# Patient Record
Sex: Female | Born: 1960
Health system: Southern US, Community
[De-identification: ages and names within clinical notes are randomized; demographics above are authoritative.]

## PROBLEM LIST (undated history)

## (undated) DIAGNOSIS — Z973 Presence of spectacles and contact lenses: Secondary | ICD-10-CM

## (undated) DIAGNOSIS — M1990 Unspecified osteoarthritis, unspecified site: Secondary | ICD-10-CM

## (undated) DIAGNOSIS — N950 Postmenopausal bleeding: Secondary | ICD-10-CM

## (undated) DIAGNOSIS — N840 Polyp of corpus uteri: Secondary | ICD-10-CM

## (undated) DIAGNOSIS — B029 Zoster without complications: Secondary | ICD-10-CM

## (undated) DIAGNOSIS — R7303 Prediabetes: Secondary | ICD-10-CM

## (undated) DIAGNOSIS — I10 Essential (primary) hypertension: Secondary | ICD-10-CM

## (undated) DIAGNOSIS — D259 Leiomyoma of uterus, unspecified: Secondary | ICD-10-CM

## (undated) HISTORY — PX: NO PAST SURGERIES: SHX2092

## (undated) HISTORY — PX: WISDOM TOOTH EXTRACTION: SHX21

## (undated) HISTORY — DX: Zoster without complications: B02.9

---

## 1998-08-31 ENCOUNTER — Encounter: Payer: Self-pay | Admitting: Obstetrics and Gynecology

## 1998-08-31 ENCOUNTER — Ambulatory Visit (HOSPITAL_COMMUNITY): Admission: RE | Admit: 1998-08-31 | Discharge: 1998-08-31 | Payer: Self-pay | Admitting: Obstetrics and Gynecology

## 2000-09-04 ENCOUNTER — Other Ambulatory Visit: Admission: RE | Admit: 2000-09-04 | Discharge: 2000-09-04 | Payer: Self-pay | Admitting: Obstetrics and Gynecology

## 2001-05-27 ENCOUNTER — Emergency Department (HOSPITAL_COMMUNITY): Admission: EM | Admit: 2001-05-27 | Discharge: 2001-05-27 | Payer: Self-pay

## 2002-04-06 ENCOUNTER — Other Ambulatory Visit: Admission: RE | Admit: 2002-04-06 | Discharge: 2002-04-06 | Payer: Self-pay | Admitting: Obstetrics and Gynecology

## 2002-04-17 ENCOUNTER — Ambulatory Visit (HOSPITAL_COMMUNITY): Admission: RE | Admit: 2002-04-17 | Discharge: 2002-04-17 | Payer: Self-pay | Admitting: Obstetrics and Gynecology

## 2002-04-17 ENCOUNTER — Encounter: Payer: Self-pay | Admitting: Obstetrics and Gynecology

## 2003-03-30 ENCOUNTER — Other Ambulatory Visit: Admission: RE | Admit: 2003-03-30 | Discharge: 2003-03-30 | Payer: Self-pay | Admitting: Obstetrics and Gynecology

## 2004-03-31 ENCOUNTER — Other Ambulatory Visit: Admission: RE | Admit: 2004-03-31 | Discharge: 2004-03-31 | Payer: Self-pay | Admitting: Obstetrics and Gynecology

## 2005-04-03 ENCOUNTER — Other Ambulatory Visit: Admission: RE | Admit: 2005-04-03 | Discharge: 2005-04-03 | Payer: Self-pay | Admitting: Obstetrics and Gynecology

## 2006-04-12 ENCOUNTER — Other Ambulatory Visit: Admission: RE | Admit: 2006-04-12 | Discharge: 2006-04-12 | Payer: Self-pay | Admitting: Obstetrics and Gynecology

## 2006-04-23 ENCOUNTER — Encounter: Admission: RE | Admit: 2006-04-23 | Discharge: 2006-04-23 | Payer: Self-pay | Admitting: Obstetrics and Gynecology

## 2007-04-15 ENCOUNTER — Other Ambulatory Visit: Admission: RE | Admit: 2007-04-15 | Discharge: 2007-04-15 | Payer: Self-pay | Admitting: Obstetrics and Gynecology

## 2009-05-17 ENCOUNTER — Emergency Department (HOSPITAL_COMMUNITY): Admission: EM | Admit: 2009-05-17 | Discharge: 2009-05-17 | Payer: Self-pay | Admitting: Family Medicine

## 2009-09-07 ENCOUNTER — Ambulatory Visit: Payer: Self-pay | Admitting: Obstetrics and Gynecology

## 2012-04-05 ENCOUNTER — Emergency Department (HOSPITAL_COMMUNITY)
Admission: EM | Admit: 2012-04-05 | Discharge: 2012-04-05 | Disposition: A | Discharge status: Home / Self Care | Payer: Self-pay | Attending: Emergency Medicine | Admitting: Emergency Medicine

## 2012-04-05 ENCOUNTER — Emergency Department (HOSPITAL_COMMUNITY): Payer: Self-pay

## 2012-04-05 ENCOUNTER — Encounter (HOSPITAL_COMMUNITY): Payer: Self-pay | Admitting: *Deleted

## 2012-04-05 DIAGNOSIS — I10 Essential (primary) hypertension: Secondary | ICD-10-CM | POA: Insufficient documentation

## 2012-04-05 DIAGNOSIS — M25559 Pain in unspecified hip: Secondary | ICD-10-CM | POA: Insufficient documentation

## 2012-04-05 HISTORY — DX: Essential (primary) hypertension: I10

## 2012-04-05 MED ORDER — KETOROLAC TROMETHAMINE 60 MG/2ML IM SOLN
60.0000 mg | Freq: Once | INTRAMUSCULAR | Status: AC
  Administered 2012-04-05: 60 mg via INTRAMUSCULAR
  Filled 2012-04-05: qty 2

## 2012-04-05 MED ORDER — OXYCODONE-ACETAMINOPHEN 5-325 MG PO TABS: 2.0000 | ORAL_TABLET | ORAL | Status: AC | PRN

## 2012-04-05 NOTE — ED Notes (Signed)
Pt given discharge instructions, explained to pt, pt in no distress. Escorted to discharge window.

## 2012-04-05 NOTE — ED Provider Notes (Signed)
History     CSN: [id number]  Arrival date & time 04/05/12  7124   First MD Initiated Contact with Patient 04/05/12 [phone]      Chief Complaint  Patient presents with  . Hip Pain    right    (Consider location/radiation/quality/duration/timing/severity/associated sxs/prior treatment) Patient is a 51 y.o. female presenting with hip pain. The history is provided by the patient.  Hip Pain   51 year old, morbidly obese, female, presents to emergency department complaining of nontraumatic.  Denies fevers, chills, rash.  She denies weakness in her lower extremity.  She has tried to take nonsteroidal medications, however, she did not get any relief.  She denies urinary tract symptoms.  Past Medical History  Diagnosis Date  . Hypertension     History reviewed. No pertinent past surgical history.  History reviewed. No pertinent family history.  History  Substance Use Topics  . Smoking status: Never Smoker   . Smokeless tobacco: Never Used  . Alcohol Use: No    OB History    Grav Para Term Preterm Abortions TAB SAB Ect Mult Living                  Review of Systems  Constitutional: Negative for fever and chills.  Genitourinary: Negative for dysuria and hematuria.  Musculoskeletal: Negative for back pain.       Right hip pain  Neurological: Negative for weakness.  All other systems reviewed and are negative.    Allergies  Review of patient's allergies indicates no known allergies.  Home Medications   Current Outpatient Rx  Name Route Sig Dispense Refill  . NAPROXEN SODIUM 220 MG PO TABS Oral Take 440 mg by mouth once.      BP 176/107  Pulse 88  Temp(Src) 98.1 F (36.7 C) (Oral)  Resp 18  Wt 290 lb (131.543 kg)  SpO2 100%  LMP 01/14/2012  Physical Exam  Vitals reviewed. Constitutional: She is oriented to person, place, and time. She appears well-developed and well-nourished.  HENT:  Head: Normocephalic and atraumatic.  Eyes: Conjunctivae are normal.    Neck: Normal range of motion.  Musculoskeletal: Normal range of motion. She exhibits tenderness. She exhibits no edema.       Mild tenderness to the right hip No increase in pain with hip flexion or internal or external rotation at the hip  Neurological: She is alert and oriented to person, place, and time.  Skin: Skin is warm and dry.  Psychiatric: She has a normal mood and affect. Thought content normal.    ED Course  Procedures (including critical care time)  Labs Reviewed - No data to display No results found.   No diagnosis found.    MDM  nontraumatic Right hip pain        Cheri Guppy, MD 04/05/12 [phone]

## 2012-04-05 NOTE — ED Notes (Signed)
Pt from home with reports of right hip pain that radiates around to right lower back that started 3 days ago. Pt denies dysuria, surgery or injury.

## 2012-04-05 NOTE — ED Notes (Signed)
Registration at bedside.

## 2012-04-05 NOTE — Discharge Instructions (Signed)
Your xray does not show any significant illness or injury.  Use ibuprofen 600 mg every 6 hours for pain. Decrease your activity.  Use percocet for more severe pain.   Follow up with your doctor for reevaluation if your symptoms persist.

## 2012-04-11 ENCOUNTER — Encounter (HOSPITAL_COMMUNITY): Payer: Self-pay | Admitting: *Deleted

## 2012-04-11 ENCOUNTER — Emergency Department (HOSPITAL_COMMUNITY)
Admission: EM | Admit: 2012-04-11 | Discharge: 2012-04-11 | Disposition: A | Discharge status: Home / Self Care | Payer: Self-pay | Attending: Emergency Medicine | Admitting: Emergency Medicine

## 2012-04-11 DIAGNOSIS — I10 Essential (primary) hypertension: Secondary | ICD-10-CM | POA: Insufficient documentation

## 2012-04-11 DIAGNOSIS — M25559 Pain in unspecified hip: Secondary | ICD-10-CM | POA: Insufficient documentation

## 2012-04-11 DIAGNOSIS — R21 Rash and other nonspecific skin eruption: Secondary | ICD-10-CM | POA: Insufficient documentation

## 2012-04-11 MED ORDER — VALACYCLOVIR HCL 1 G PO TABS: 1000.0000 mg | ORAL_TABLET | Freq: Three times a day (TID) | ORAL | Status: AC

## 2012-04-11 MED ORDER — OXYCODONE-ACETAMINOPHEN 5-325 MG PO TABS: 1.0000 | ORAL_TABLET | Freq: Four times a day (QID) | ORAL | Status: AC | PRN

## 2012-04-11 NOTE — ED Notes (Signed)
Pt states "I think my doctor is out of town, I'm here for a follow up, I called my doctor to call in some more medicine, my right hip is still bothering me"

## 2012-04-11 NOTE — ED Provider Notes (Signed)
Medical screening examination/treatment/procedure(s) were conducted as a shared visit with non-physician practitioner(s) and myself.  I personally evaluated the patient during the encounter  The patient appears to have zoster with a dermatomal distribution to explain her pain  Lyanne Co, MD 04/11/12 [phone]

## 2012-04-11 NOTE — Discharge Instructions (Signed)
Return here as needed.       Shingles Shingles is caused by the same virus that causes chickenpox (varicella zoster virus or VZV). Shingles often occurs many years or decades after having chickenpox. That is why it is more common in adults older than 50 years. The virus reactivates and breaks out as an infection in a nerve root. SYMPTOMS   The initial feeling (sensations) may be pain. This pain is usually described as:   Burning.   Stabbing.   Throbbing.   Tingling in the nerve root.   A red rash will follow in a couple days. The rash may occur in any area of the body and is usually on one side (unilateral) of the body in a band or belt-like pattern. The rash usually starts out as very small blisters (vesicles). They will dry up after 7 to 10 days. This is not usually a significant problem except for the pain it causes.   Long-lasting (chronic) pain is more likely in an elderly person. It can last months to years. This condition is called postherpetic neuralgia.  Shingles can be an extremely severe infection in someone with AIDS, a weakened immune system, or with forms of leukemia. It can also be severe if you are taking transplant medicines or other medicines that weaken the immune system. TREATMENT  Your caregiver will often treat you with:  Antiviral drugs.   Anti-inflammatory drugs.   Pain medicines.  Bed rest is very important in preventing the pain associated with herpes zoster (postherpetic neuralgia). Application of heat in the form of a hot water bottle or electric heating pad or gentle pressure with the hand is recommended to help with the pain or discomfort. PREVENTION  A varicella zoster vaccine is available to help protect against the virus. The Food and Drug Administration approved the varicella zoster vaccine for individuals 38 years of age and older. HOME CARE INSTRUCTIONS   Cool compresses to the area of rash may be helpful.   Only take over-the-counter or  prescription medicines for pain, discomfort, or fever as directed by your caregiver.   Avoid contact with:   Babies.   Pregnant women.   Children with eczema.   Elderly people with transplants.   People with chronic illnesses, such as leukemia and AIDS.   If the area involved is on your face, you may receive a referral for follow-up to a specialist. It is very important to keep all follow-up appointments. This will help avoid eye complications, chronic pain, or disability.  SEEK IMMEDIATE MEDICAL CARE IF:   You develop any pain (headache) in the area of the face or eye. This must be followed carefully by your caregiver or ophthalmologist. An infection in part of your eye (cornea) can be very serious. It could lead to blindness.   You do not have pain relief from prescribed medicines.   Your redness or swelling spreads.   The area involved becomes very swollen and painful.   You have a fever.   You notice any red or painful lines extending away from the affected area toward your heart (lymphangitis).   Your condition is worsening or has changed.  Document Released: 11/05/2005 Document Revised: 10/25/2011 Document Reviewed: 10/10/2009 [facility] Patient Information 2012 [place], [place].

## 2012-04-11 NOTE — ED Provider Notes (Signed)
History     CSN: [id number]  Arrival date & time 04/11/12  4782   First MD Initiated Contact with Patient 04/11/12 [phone]      Chief Complaint  Patient presents with  . Hip Pain    (Consider location/radiation/quality/duration/timing/severity/associated sxs/prior treatment) HPI Patient has emergency department with lower back and right hip pain.  She states that she was seen in emergency department about 6 days ago for evaluation of this pain.  She is placed on pain medication and anti-inflammatories.  She states that several days after her visit she noticed a rash on her lower leg and then this morning noticed a rash on her knee.  Patient says the rash is somewhat painful, but does not itch.  Past Medical History  Diagnosis Date  . Hypertension     History reviewed. No pertinent past surgical history.  No family history on file.  History  Substance Use Topics  . Smoking status: Never Smoker   . Smokeless tobacco: Never Used  . Alcohol Use: No    OB History    Grav Para Term Preterm Abortions TAB SAB Ect Mult Living                  Review of Systems All other systems negative except as documented in the HPI. All pertinent positives and negatives as reviewed in the HPI.  Allergies  Review of patient's allergies indicates no known allergies.  Home Medications   Current Outpatient Rx  Name Route Sig Dispense Refill  . NAPROXEN SODIUM 220 MG PO TABS Oral Take 220 mg by mouth 2 (two) times daily as needed. For pain.    . OXYCODONE-ACETAMINOPHEN 5-325 MG PO TABS Oral Take 2 tablets by mouth every 4 (four) hours as needed for pain. 15 tablet 0    BP 181/78  Pulse 70  Temp(Src) 98.4 F (36.9 C) (Oral)  Resp 19  Wt 290 lb (131.543 kg)  SpO2 100%  LMP 01/14/2012  Physical Exam  Constitutional: She is oriented to person, place, and time. She appears well-developed and well-nourished.  HENT:  Head: Normocephalic and atraumatic.  Cardiovascular: Normal rate and  regular rhythm.   Pulmonary/Chest: Effort normal and breath sounds normal.  Neurological: She is alert and oriented to person, place, and time. She displays normal reflexes. She exhibits normal muscle tone. Coordination normal.  Skin: Skin is warm and dry.       ED Course  Procedures (including critical care time)  I feel that the patient has herpes zoster based on her presentation initially with her with the pain in L5-L4, region.  The rash started 2 days after presentation to the emergency department thus lending itself to be consistent with herpes zoster.  Patient is given the plan and she voices an understanding.   MDM          Carlyle Dolly, PA-C 04/11/12 [phone]

## 2012-07-16 IMAGING — CR DG HIP COMPLETE 2+V*R*
3 series · 3 of 3 positions shown · non-contrast
Comparison: None.

CLINICAL DATA: Right hip pain.

RIGHT HIP - COMPLETE 2+ VIEW

[t pelvis ap]
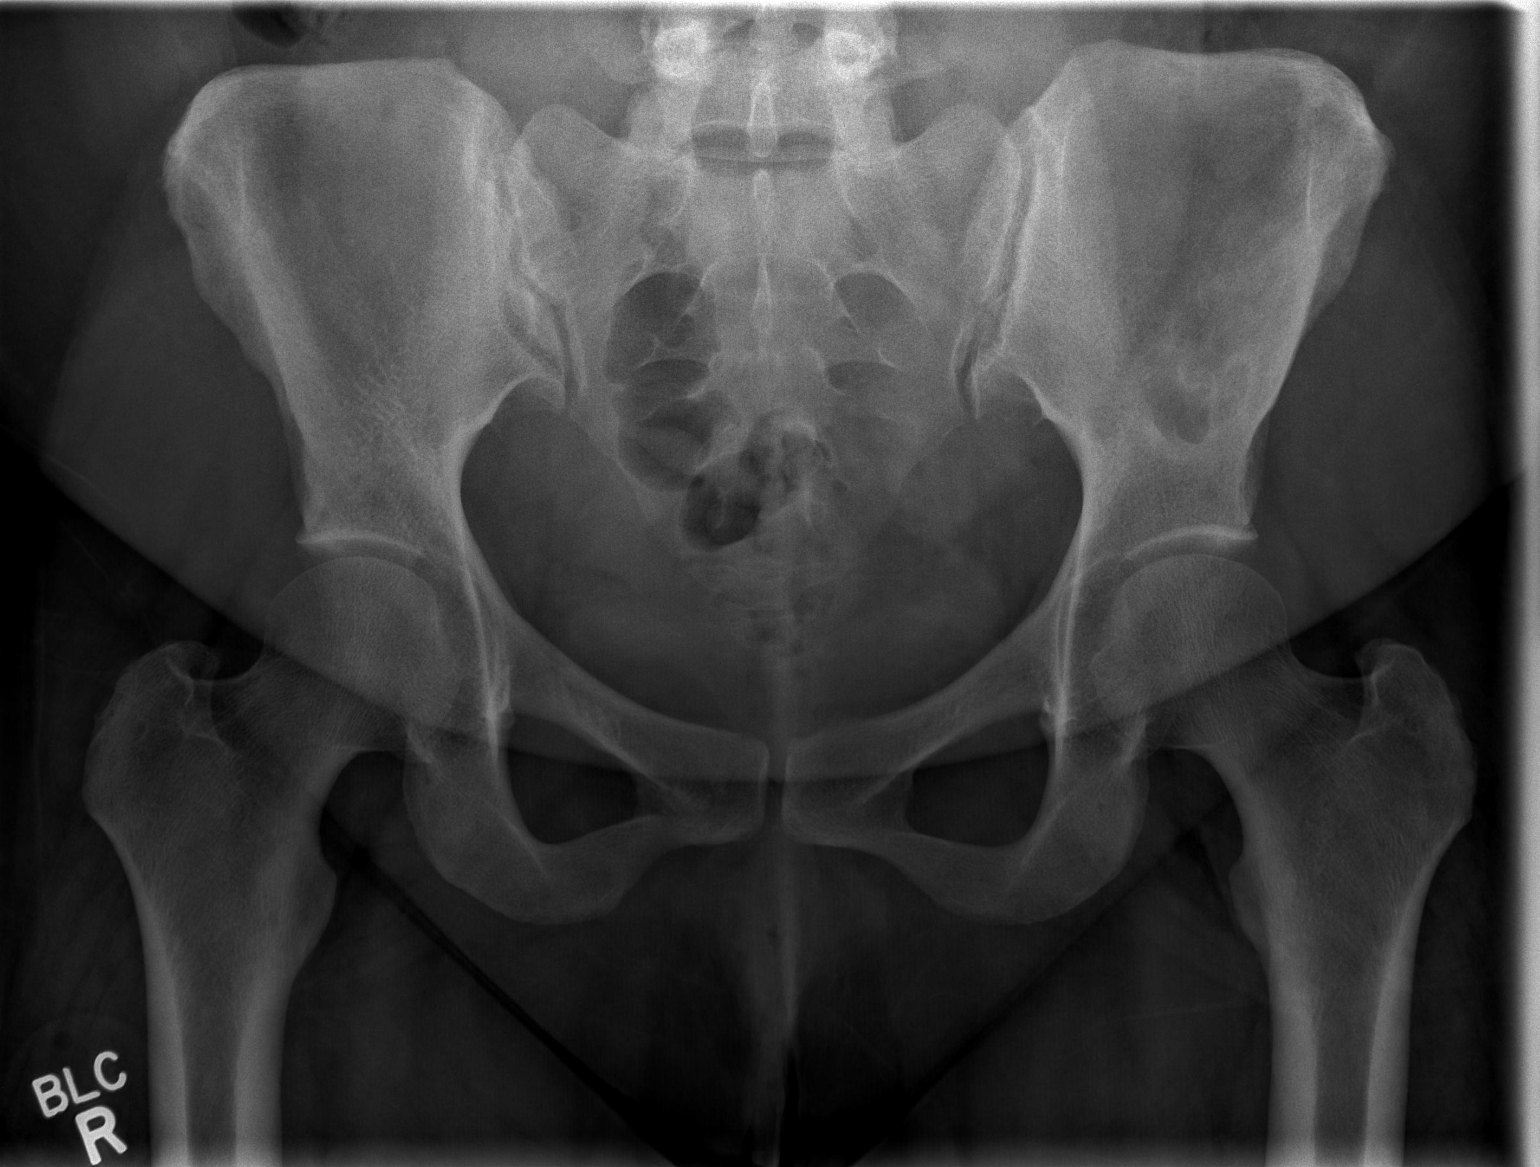

[t hip ap right]
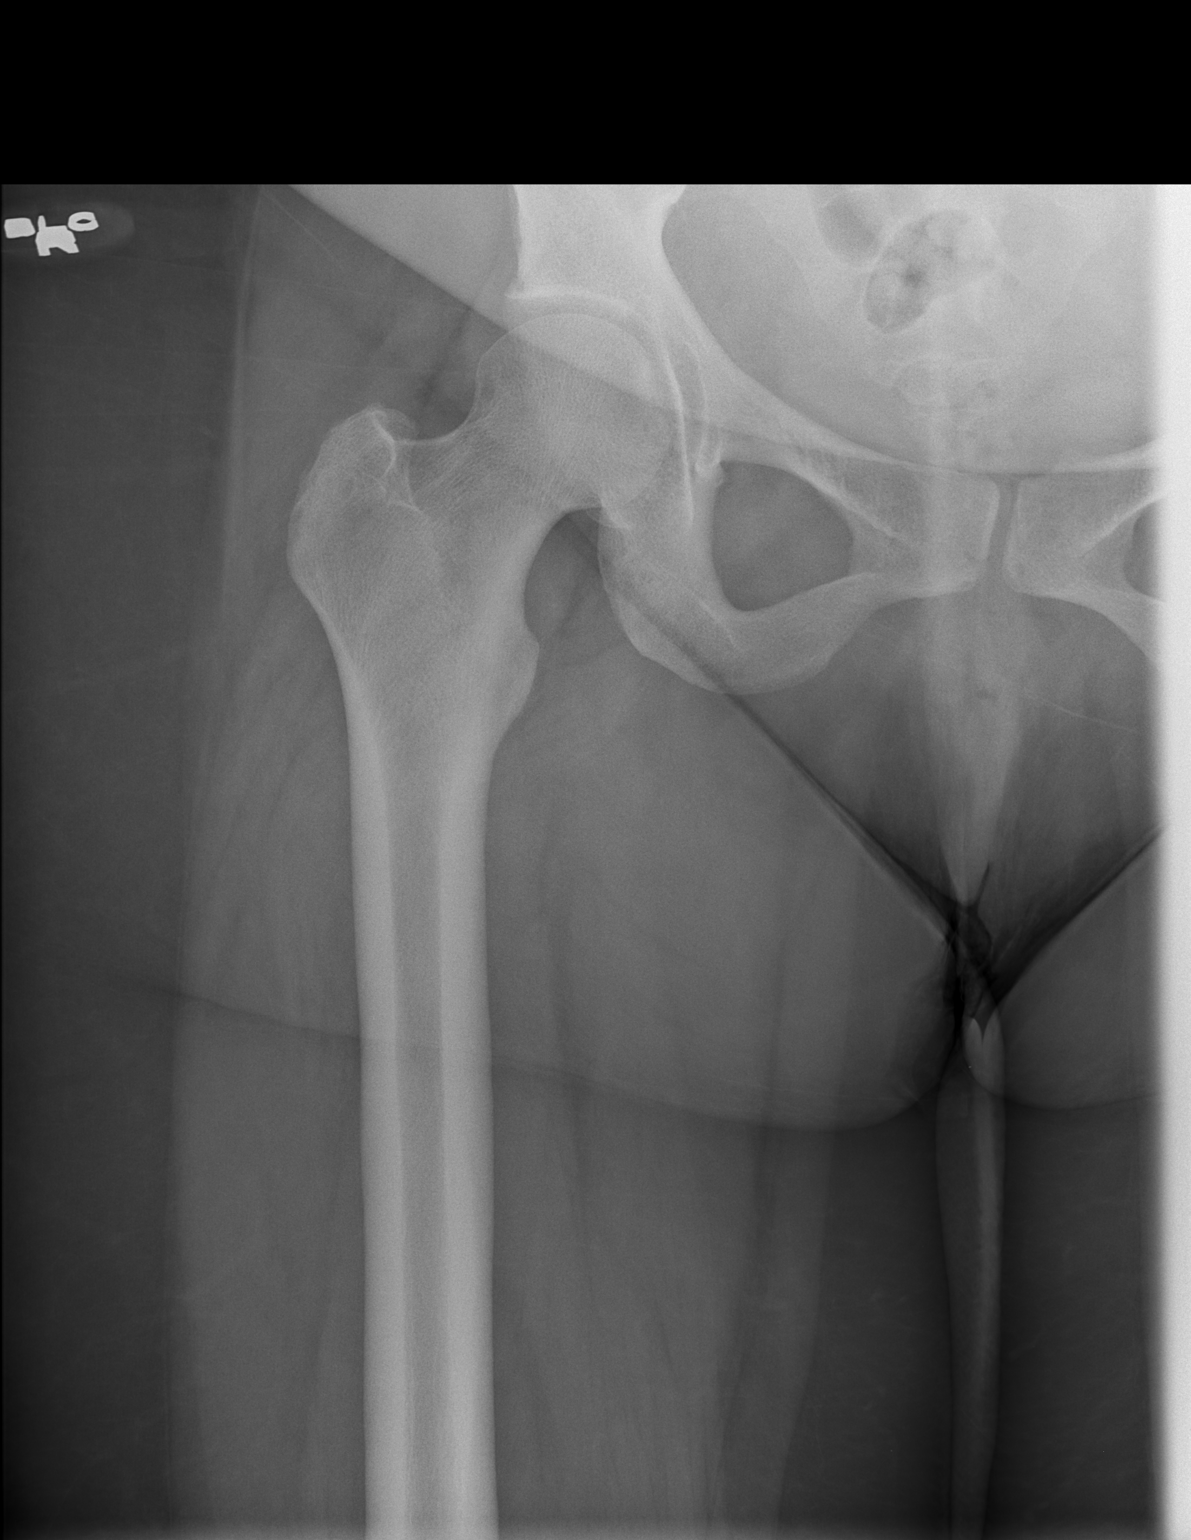

[t hip frog leg right]
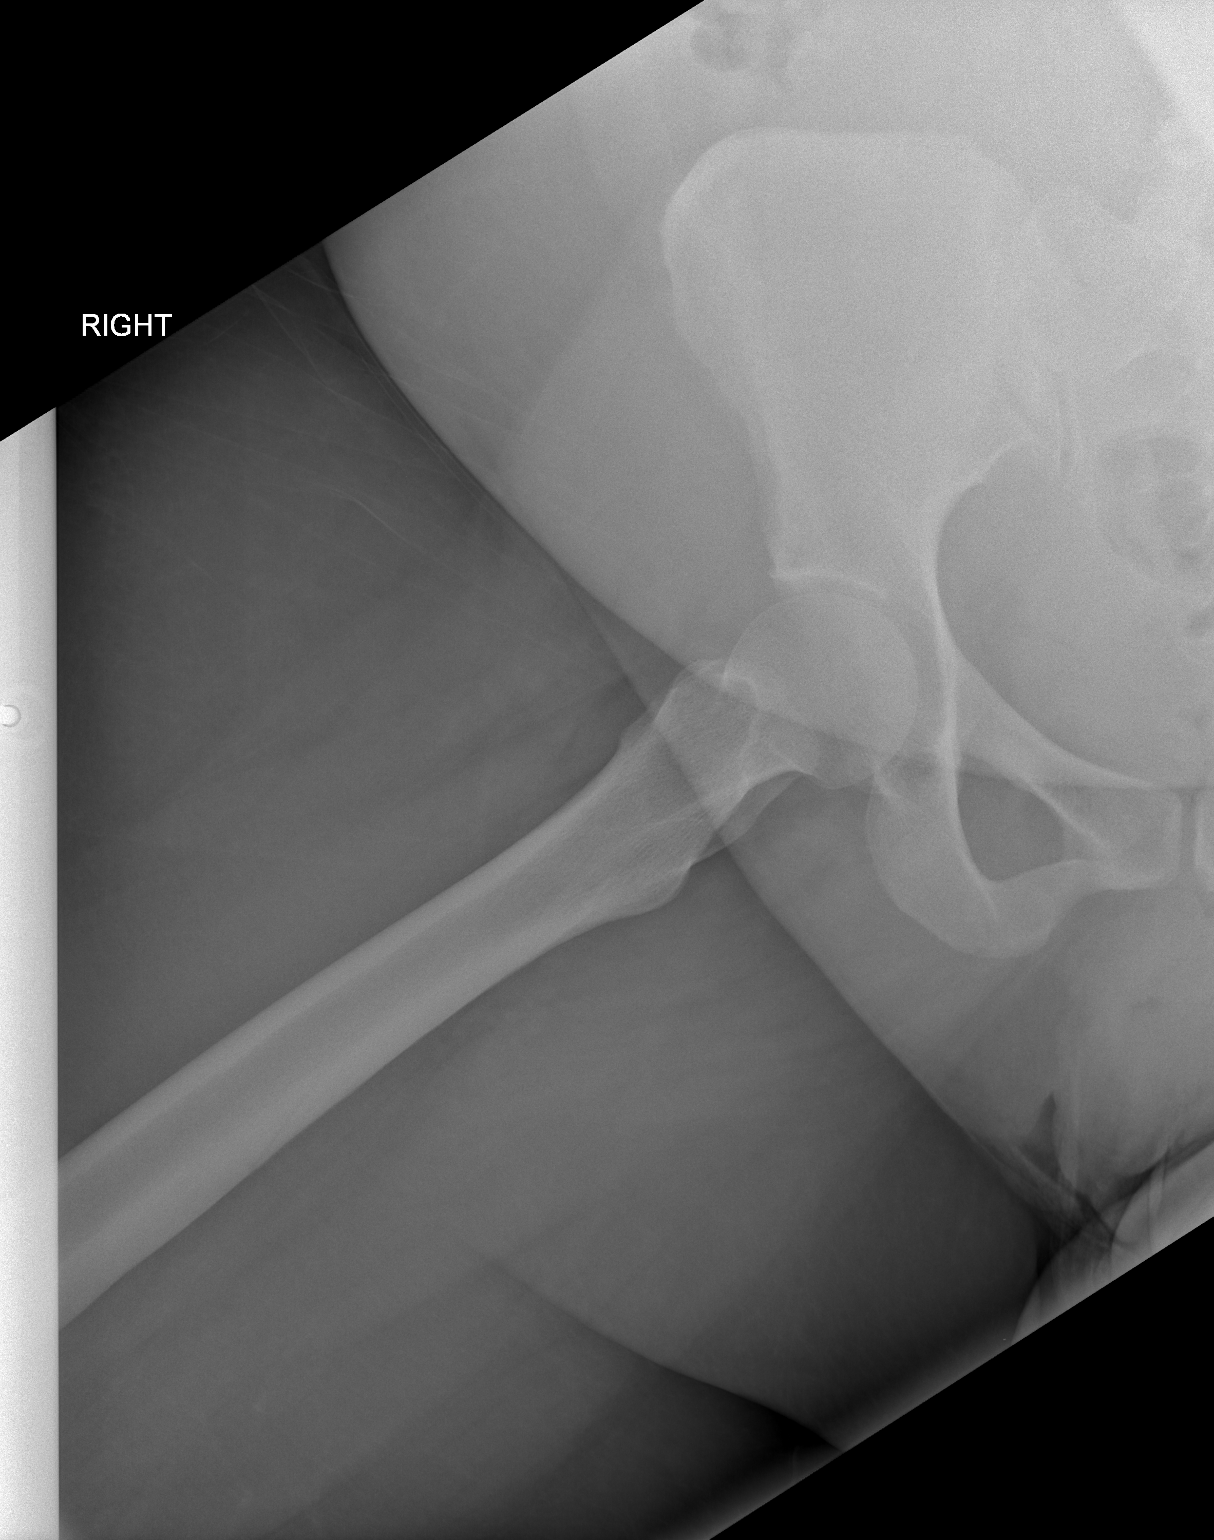

[3 of 3 positions shown; findings below may reference images not displayed]

FINDINGS: The osseous structures of the hip are normal.  No joint
space narrowing or appreciable effusion.
IMPRESSION: Normal right hip.

## 2013-04-06 ENCOUNTER — Ambulatory Visit (INDEPENDENT_AMBULATORY_CARE_PROVIDER_SITE_OTHER): Payer: BC Managed Care – PPO | Admitting: Women's Health

## 2013-04-06 ENCOUNTER — Encounter: Payer: Self-pay | Admitting: Women's Health

## 2013-04-06 ENCOUNTER — Other Ambulatory Visit (HOSPITAL_COMMUNITY)
Admission: RE | Admit: 2013-04-06 | Discharge: 2013-04-06 | Disposition: A | Discharge status: Home / Self Care | Payer: Self-pay | Source: Ambulatory Visit | Attending: Women's Health | Admitting: Women's Health

## 2013-04-06 VITALS — BP 144/90 | Ht 67.0 in | Wt 268.0 lb

## 2013-04-06 DIAGNOSIS — N912 Amenorrhea, unspecified: Secondary | ICD-10-CM

## 2013-04-06 DIAGNOSIS — I10 Essential (primary) hypertension: Secondary | ICD-10-CM

## 2013-04-06 DIAGNOSIS — D219 Benign neoplasm of connective and other soft tissue, unspecified: Secondary | ICD-10-CM | POA: Insufficient documentation

## 2013-04-06 DIAGNOSIS — D259 Leiomyoma of uterus, unspecified: Secondary | ICD-10-CM

## 2013-04-06 DIAGNOSIS — Z01419 Encounter for gynecological examination (general) (routine) without abnormal findings: Secondary | ICD-10-CM | POA: Insufficient documentation

## 2013-04-06 LAB — FOLLICLE STIMULATING HORMONE: FSH: 0.4 m[IU]/mL

## 2013-04-06 LAB — PROLACTIN: Prolactin: 18.6 ng/mL

## 2013-04-06 NOTE — Progress Notes (Signed)
Lindsey Norton Jan 26, 1961 [id number]    History:    The patient presents for annual exam.  Amenorrheic x1 year. History of elevated prolactin, Dostinex in the past, has been off x4 years. History of 5 mm pituitary microadenoma with a negative MRI 2007. Not sexually active in years. Last Pap 2009, last office visit here 2008 due to lack of insurance. Shingles 2013. Hypertension primary care manages labs and meds. Last mammogram 2003. Had a negative colonoscopy approximately 5 years ago. Asymptomatic fibroids.   Past medical history, past surgical history, family history and social history were all reviewed and documented in the EPIC chart. Has been in and out of work for the past 5 years, without insurance. Has been seen by primary care to manage hypertension. Numerous family members with hypertension.   ROS:  A  ROS was performed and pertinent positives and negatives are included in the history.  Exam:  Filed Vitals:   04/06/13 0810  BP: 144/90    General appearance:  Normal Head/Neck:  Normal, without cervical or supraclavicular adenopathy. Thyroid:  Symmetrical, normal in size, without palpable masses or nodularity. Respiratory  Effort:  Normal  Auscultation:  Clear without wheezing or rhonchi Cardiovascular  Auscultation:  Regular rate, without rubs, murmurs or gallops  Edema/varicosities:  Not grossly evident Abdominal  Soft,nontender, without masses, guarding or rebound.  Liver/spleen:  No organomegaly noted  Hernia:  None appreciated  Skin  Inspection:  Grossly normal  Palpation:  Grossly normal Neurologic/psychiatric  Orientation:  Normal with appropriate conversation.  Mood/affect:  Normal  Genitourinary    Breasts: Examined lying and sitting.     Right: Without masses, retractions, discharge or axillary adenopathy.     Left: Without masses, retractions, discharge or axillary adenopathy.   Inguinal/mons:  Normal without inguinal adenopathy  External genitalia:   Normal  BUS/Urethra/Skene's glands:  Normal  Bladder:  Normal  Vagina:  Normal  Cervix:  Normal  Uterus:  Enlarged, 16 week size, nontender,  Midline and mobile  Adnexa/parametria:     Rt: Without masses or tenderness.   Lt: Without masses or tenderness.  Anus and perineum: Normal  Digital rectal exam: Normal sphincter tone without palpated masses or tenderness  Assessment/Plan:  52 y.o. SBF G0 for annual exam.     Hypertension-primary care labs and meds Obesity History of pituitary microadenoma/elevated prolactin Amenorrhea fibroids  Plan: Ultrasound, prolactin,  FSH, UA, Pap. New screening guidelines reviewed, last normal Pap 2009. SBE's, overdue, instructed to schedule ASAP, due for colonoscopy will schedule. Reviewed importance of decreasing calories and increasing exercise for weight loss and health. Condoms encouraged if become sexually active. Instructed to call if any further bleeding, will triage based on results.    Harrington Challenger [facility], 8:41 AM 04/06/2013

## 2013-04-06 NOTE — Patient Instructions (Addendum)

## 2013-04-07 ENCOUNTER — Encounter: Payer: Self-pay | Admitting: Obstetrics and Gynecology

## 2013-04-14 ENCOUNTER — Encounter: Payer: Self-pay | Admitting: Obstetrics and Gynecology

## 2013-04-27 ENCOUNTER — Encounter: Payer: Self-pay | Admitting: Women's Health

## 2013-04-27 ENCOUNTER — Ambulatory Visit (INDEPENDENT_AMBULATORY_CARE_PROVIDER_SITE_OTHER): Payer: BC Managed Care – PPO

## 2013-04-27 ENCOUNTER — Ambulatory Visit (INDEPENDENT_AMBULATORY_CARE_PROVIDER_SITE_OTHER): Payer: BC Managed Care – PPO | Admitting: Women's Health

## 2013-04-27 ENCOUNTER — Other Ambulatory Visit: Payer: Self-pay | Admitting: Women's Health

## 2013-04-27 DIAGNOSIS — D251 Intramural leiomyoma of uterus: Secondary | ICD-10-CM

## 2013-04-27 DIAGNOSIS — D259 Leiomyoma of uterus, unspecified: Secondary | ICD-10-CM

## 2013-04-27 DIAGNOSIS — D219 Benign neoplasm of connective and other soft tissue, unspecified: Secondary | ICD-10-CM

## 2013-04-27 DIAGNOSIS — R9389 Abnormal findings on diagnostic imaging of other specified body structures: Secondary | ICD-10-CM

## 2013-04-27 DIAGNOSIS — N852 Hypertrophy of uterus: Secondary | ICD-10-CM

## 2013-04-27 DIAGNOSIS — N83339 Acquired atrophy of ovary and fallopian tube, unspecified side: Secondary | ICD-10-CM

## 2013-04-27 DIAGNOSIS — N912 Amenorrhea, unspecified: Secondary | ICD-10-CM

## 2013-04-27 MED ORDER — MEDROXYPROGESTERONE ACETATE 10 MG PO TABS: ORAL_TABLET | ORAL | Status: DC

## 2013-04-27 NOTE — Patient Instructions (Addendum)
Fibroids Fibroids are lumps (tumors) that can occur any place in a woman's body. These lumps are not cancerous. Fibroids vary in size, weight, and where they grow. HOME CARE  Do not take aspirin.  Write down the number of pads or tampons you use during your period. Tell your doctor. This can help determine the best treatment for you. GET HELP RIGHT AWAY IF:  You have pain in your lower belly (abdomen) that is not helped with medicine.  You have cramps that are not helped with medicine.  You have more bleeding between or during your period.  You feel lightheaded or pass out (faint).  Your lower belly pain gets worse. MAKE SURE YOU:  Understand these instructions.  Will watch your condition.  Will get help right away if you are not doing well or get worse. Document Released: 12/08/2010 Document Revised: 01/28/2012 Document Reviewed: 12/08/2010 [employer] Patient Information 2014 [employer].  

## 2013-04-27 NOTE — Progress Notes (Signed)
Patient ID: Lindsey Norton, female   DOB: 21-Mar-1961, 52 y.o.   MRN: [id number] Presents for ultrasound. History of fibroids. At annual exam 5/19 reports amenorrhea for one year,  FSH  0.4. History of increased prolactin, had been on Dostinex in the past, prolactin 18.6. Not sexually active years. Had a cycle after annual, May 23 without medication.   Ultrasound: Enlarged uterus 228 mm, fibroid left 13 x 12 x 16 cm, second fibroid on previous ultrasound unable to IUD do to enlarged uterus. Displaced endometrium, endometrial cavity prominent avascular, 9.9 mm. Right ovary atrophic, left ovary not seen. Negative cul-de-sac.  Fibroid uterus Amenorrhea  Plan: Provera 10 mg for 5 days every other month if no cycle. Instructed to call if no menstrual cycle after Provera. Reviewed fibroid is large, report symptoms of urinary changes, GI changes.

## 2013-05-06 ENCOUNTER — Encounter: Payer: Self-pay | Admitting: Women's Health

## 2013-09-24 ENCOUNTER — Other Ambulatory Visit: Payer: Self-pay

## 2014-09-03 ENCOUNTER — Other Ambulatory Visit: Payer: Self-pay

## 2016-08-02 ENCOUNTER — Ambulatory Visit (INDEPENDENT_AMBULATORY_CARE_PROVIDER_SITE_OTHER): Payer: 59 | Admitting: Women's Health

## 2016-08-02 ENCOUNTER — Encounter: Payer: Self-pay | Admitting: Women's Health

## 2016-08-02 VITALS — BP 130/85 | Ht 66.0 in | Wt 302.0 lb

## 2016-08-02 DIAGNOSIS — D259 Leiomyoma of uterus, unspecified: Secondary | ICD-10-CM

## 2016-08-02 DIAGNOSIS — N912 Amenorrhea, unspecified: Secondary | ICD-10-CM

## 2016-08-02 DIAGNOSIS — Z01419 Encounter for gynecological examination (general) (routine) without abnormal findings: Secondary | ICD-10-CM | POA: Diagnosis not present

## 2016-08-02 NOTE — Patient Instructions (Addendum)
Menopause is a normal process in which your reproductive ability comes to an end. This process happens gradually over a span of months to years, usually between the ages of 48 and 55. Menopause is complete when you have missed 12 consecutive menstrual periods. It is important to talk with your health care provider about some of the most common conditions that affect postmenopausal women, such as heart disease, cancer, and bone loss (osteoporosis). Adopting a healthy lifestyle and getting preventive care can help to promote your health and wellness. Those actions can also lower your chances of developing some of these common conditions. WHAT SHOULD I KNOW ABOUT MENOPAUSE? During menopause, you may experience a number of symptoms, such as:  Moderate-to-severe hot flashes.  Night sweats.  Decrease in sex drive.  Mood swings.  Headaches.  Tiredness.  Irritability.  Memory problems.  Insomnia. Choosing to treat or not to treat menopausal changes is an individual decision that you make with your health care provider. WHAT SHOULD I KNOW ABOUT HORMONE REPLACEMENT THERAPY AND SUPPLEMENTS? Hormone therapy products are effective for treating symptoms that are associated with menopause, such as hot flashes and night sweats. Hormone replacement carries certain risks, especially as you become older. If you are thinking about using estrogen or estrogen with progestin treatments, discuss the benefits and risks with your health care provider. WHAT SHOULD I KNOW ABOUT HEART DISEASE AND STROKE? Heart disease, heart attack, and stroke become more likely as you age. This may be due, in part, to the hormonal changes that your body experiences during menopause. These can affect how your body processes dietary fats, triglycerides, and cholesterol. Heart attack and stroke are both medical emergencies. There are many things that you can do to help prevent heart disease and stroke:  Have your blood pressure  checked at least every 1-2 years. High blood pressure causes heart disease and increases the risk of stroke.  If you are 55-79 years old, ask your health care provider if you should take aspirin to prevent a heart attack or a stroke.  Do not use any tobacco products, including cigarettes, chewing tobacco, or electronic cigarettes. If you need help quitting, ask your health care provider.  It is important to eat a healthy diet and maintain a healthy weight.  Be sure to include plenty of vegetables, fruits, low-fat dairy products, and lean protein.  Avoid eating foods that are high in solid fats, added sugars, or salt (sodium).  Get regular exercise. This is one of the most important things that you can do for your health.  Try to exercise for at least 150 minutes each week. The type of exercise that you do should increase your heart rate and make you sweat. This is known as moderate-intensity exercise.  Try to do strengthening exercises at least twice each week. Do these in addition to the moderate-intensity exercise.  Know your numbers.Ask your health care provider to check your cholesterol and your blood glucose. Continue to have your blood tested as directed by your health care provider. WHAT SHOULD I KNOW ABOUT CANCER SCREENING? There are several types of cancer. Take the following steps to reduce your risk and to catch any cancer development as early as possible. Breast Cancer  Practice breast self-awareness.  This means understanding how your breasts normally appear and feel.  It also means doing regular breast self-exams. Let your health care provider know about any changes, no matter how small.  If you are 40 or older, have a clinician do a   breast exam (clinical breast exam or CBE) every year. Depending on your age, family history, and medical history, it may be recommended that you also have a yearly breast X-ray (mammogram).  If you have a family history of breast cancer,  talk with your health care provider about genetic screening.  If you are at high risk for breast cancer, talk with your health care provider about having an MRI and a mammogram every year.  Breast cancer (BRCA) gene test is recommended for women who have family members with BRCA-related cancers. Results of the assessment will determine the need for genetic counseling and BRCA1 and for BRCA2 testing. BRCA-related cancers include these types:  Breast. This occurs in males or females.  Ovarian.  Tubal. This may also be called fallopian tube cancer.  Cancer of the abdominal or pelvic lining (peritoneal cancer).  Prostate.  Pancreatic. Cervical, Uterine, and Ovarian Cancer Your health care provider may recommend that you be screened regularly for cancer of the pelvic organs. These include your ovaries, uterus, and vagina. This screening involves a pelvic exam, which includes checking for microscopic changes to the surface of your cervix (Pap test).  For women ages 21-65, health care providers may recommend a pelvic exam and a Pap test every three years. For women ages 77-65, they may recommend the Pap test and pelvic exam, combined with testing for human papilloma virus (HPV), every five years. Some types of HPV increase your risk of cervical cancer. Testing for HPV may also be done on women of any age who have unclear Pap test results.  Other health care providers may not recommend any screening for nonpregnant women who are considered low risk for pelvic cancer and have no symptoms. Ask your health care provider if a screening pelvic exam is right for you.  If you have had past treatment for cervical cancer or a condition that could lead to cancer, you need Pap tests and screening for cancer for at least 20 years after your treatment. If Pap tests have been discontinued for you, your risk factors (such as having a new sexual partner) need to be reassessed to determine if you should start having  screenings again. Some women have medical problems that increase the chance of getting cervical cancer. In these cases, your health care provider may recommend that you have screening and Pap tests more often.  If you have a family history of uterine cancer or ovarian cancer, talk with your health care provider about genetic screening.  If you have vaginal bleeding after reaching menopause, tell your health care provider.  There are currently no reliable tests available to screen for ovarian cancer. Lung Cancer Lung cancer screening is recommended for adults 3-70 years old who are at high risk for lung cancer because of a history of smoking. A yearly low-dose CT scan of the lungs is recommended if you:  Currently smoke.  Have a history of at least 30 pack-years of smoking and you currently smoke or have quit within the past 15 years. A pack-year is smoking an average of one pack of cigarettes per day for one year. Yearly screening should:  Continue until it has been 15 years since you quit.  Stop if you develop a health problem that would prevent you from having lung cancer treatment. Colorectal Cancer  This type of cancer can be detected and can often be prevented.  Routine colorectal cancer screening usually begins at age 38 and continues through age 12.  If you have  risk factors for colon cancer, your health care provider may recommend that you be screened at an earlier age.  If you have a family history of colorectal cancer, talk with your health care provider about genetic screening.  Your health care provider may also recommend using home test kits to check for hidden blood in your stool.  A small camera at the end of a tube can be used to examine your colon directly (sigmoidoscopy or colonoscopy). This is done to check for the earliest forms of colorectal cancer.  Direct examination of the colon should be repeated every 5-10 years until age 67. However, if early forms of  precancerous polyps or small growths are found or if you have a family history or genetic risk for colorectal cancer, you may need to be screened more often. Skin Cancer  Check your skin from head to toe regularly.  Monitor any moles. Be sure to tell your health care provider:  About any new moles or changes in moles, especially if there is a change in a mole's shape or color.  If you have a mole that is larger than the size of a pencil eraser.  If any of your family members has a history of skin cancer, especially at a young age, talk with your health care provider about genetic screening.  Always use sunscreen. Apply sunscreen liberally and repeatedly throughout the day.  Whenever you are outside, protect yourself by wearing long sleeves, pants, a wide-brimmed hat, and sunglasses. WHAT SHOULD I KNOW ABOUT OSTEOPOROSIS? Osteoporosis is a condition in which bone destruction happens more quickly than new bone creation. After menopause, you may be at an increased risk for osteoporosis. To help prevent osteoporosis or the bone fractures that can happen because of osteoporosis, the following is recommended:  If you are 39-61 years old, get at least 1,000 mg of calcium and at least 600 mg of vitamin D per day.  If you are older than age 16 but younger than age 7, get at least 1,200 mg of calcium and at least 600 mg of vitamin D per day.  If you are older than age 47, get at least 1,200 mg of calcium and at least 800 mg of vitamin D per day. Smoking and excessive alcohol intake increase the risk of osteoporosis. Eat foods that are rich in calcium and vitamin D, and do weight-bearing exercises several times each week as directed by your health care provider. WHAT SHOULD I KNOW ABOUT HOW MENOPAUSE AFFECTS [place]? Depression may occur at any age, but it is more common as you become older. Common symptoms of depression include:  Low or sad mood.  Changes in sleep patterns.  Changes  in appetite or eating patterns.  Feeling an overall lack of motivation or enjoyment of activities that you previously enjoyed.  Frequent crying spells. Talk with your health care provider if you think that you are experiencing depression. WHAT SHOULD I KNOW ABOUT IMMUNIZATIONS? It is important that you get and maintain your immunizations. These include:  Tetanus, diphtheria, and pertussis (Tdap) booster vaccine.  Influenza every year before the flu season begins.  Pneumonia vaccine.  Shingles vaccine. Your health care provider may also recommend other immunizations.   This information is not intended to replace advice given to you by your health care provider. Make sure you discuss any questions you have with your health care provider.   Document Released: 12/28/2005 Document Revised: 11/26/2014 Document Reviewed: 07/08/2014 Elsevier Interactive Patient Education 2016 [employer] Basic Carbohydrate Counting for Diabetes Mellitus Carbohydrate counting is a method for keeping track of the amount of carbohydrates you eat. Eating carbohydrates naturally increases the level of sugar (glucose) in your blood, so it is important for you to know the amount that is okay for you to have in every meal. Carbohydrate counting helps keep the level of glucose in your blood within normal limits. The amount of carbohydrates allowed is different for every person. A dietitian can help you calculate the amount that is right for you. Once you know the amount of carbohydrates you can have, you can count the carbohydrates in the foods you want to eat. Carbohydrates are found in the following foods:  Grains, such as breads and cereals.  Dried beans and soy products.  Starchy vegetables, such as potatoes, peas, and corn.  Fruit and fruit juices.  Milk and yogurt.  Sweets and snack foods, such as cake, cookies, candy, chips, soft drinks, and fruit drinks. CARBOHYDRATE COUNTING There are two ways to  count the carbohydrates in your food. You can use either of the methods or a combination of both. Reading the "Nutrition Facts" on Packaged Food The "Nutrition Facts" is an area that is included on the labels of almost all packaged food and beverages in the [place]. It includes the serving size of that food or beverage and information about the nutrients in each serving of the food, including the grams (g) of carbohydrate per serving.  Decide the number of servings of this food or beverage that you will be able to eat or drink. Multiply that number of servings by the number of grams of carbohydrate that is listed on the label for that serving. The total will be the amount of carbohydrates you will be having when you eat or drink this food or beverage. Learning Standard Serving Sizes of Food When you eat food that is not packaged or does not include "Nutrition Facts" on the label, you need to measure the servings in order to count the amount of carbohydrates.A serving of most carbohydrate-rich foods contains about 15 g of carbohydrates. The following list includes serving sizes of carbohydrate-rich foods that provide 15 g ofcarbohydrate per serving:   1 slice of bread (1 oz) or 1 six-inch tortilla.    of a hamburger bun or English muffin.  4-6 crackers.   cup unsweetened dry cereal.    cup hot cereal.   cup rice or pasta.    cup mashed potatoes or  of a large baked potato.  1 cup fresh fruit or one small piece of fruit.    cup canned or frozen fruit or fruit juice.  1 cup milk.   cup plain fat-free yogurt or yogurt sweetened with artificial sweeteners.   cup cooked dried beans or starchy vegetable, such as peas, corn, or potatoes.  Decide the number of standard-size servings that you will eat. Multiply that number of servings by 15 (the grams of carbohydrates in that serving). For example, if you eat 2 cups of strawberries, you will have eaten 2 servings and 30 g of  carbohydrates (2 servings x 15 g = 30 g). For foods such as soups and casseroles, in which more than one food is mixed in, you will need to count the carbohydrates in each food that is included. EXAMPLE OF CARBOHYDRATE COUNTING Sample Dinner  3 oz chicken breast.   cup of brown rice.   cup of corn.  1 cup milk.   1 cup   strawberries with sugar-free whipped topping.  Carbohydrate Calculation Step 1: Identify the foods that contain carbohydrates:   Rice.   Corn.   Milk.   Strawberries. Step 2:Calculate the number of servings eaten of each:   2 servings of rice.   1 serving of corn.   1 serving of milk.   1 serving of strawberries. Step 3: Multiply each of those number of servings by 15 g:   2 servings of rice x 15 g = 30 g.   1 serving of corn x 15 g = 15 g.   1 serving of milk x 15 g = 15 g.   1 serving of strawberries x 15 g = 15 g. Step 4: Add together all of the amounts to find the total grams of carbohydrates eaten: 30 g + 15 g + 15 g + 15 g = 75 g.   This information is not intended to replace advice given to you by your health care provider. Make sure you discuss any questions you have with your health care provider.   Document Released: 11/05/2005 Document Revised: 11/26/2014 Document Reviewed: 10/02/2013 Elsevier Interactive Patient Education [employer].

## 2016-08-02 NOTE — Progress Notes (Signed)
Lindsey Norton Mar 06, 1961 [id number]    History:    Presents for annual exam. Amenorrheic for several years, last annual Mar 30, 2013, no postmenopausal bleeding. 2014 FSH 0.4. History of irregular cycles with periods of amenorrhea. Denies being sexually active for over 2 years. Normal Pap and mammogram history. History of increased Prolactin and microadenoma with negative MRI in 2007. Per pt, negative colonoscopy 2009. Shingles 2013. Weight has increased 34 lbs since visit in 2014, states she is stress eating, caring for elderly mother. Recently lost 2.5 lbs and expresses motivation to exercise but has back spasm, will get that resolved first. PCP manages HTN, well-controlled, prediabetic, labs routinely checked, per patient last Hgba1c was 6.1.  Past medical history, past surgical history, family history and social history were all reviewed and documented in the EPIC chart. 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 Father passed away from kidney and spinal cancer. Mother had stroke in nursing home.  ROS:  A ROS was performed and pertinent positives and negatives are included.  Exam:  Vitals:   08/02/16 0814  BP: 130/85  Weight: (!) 302 lb (137 kg)  Height: 5\' 6"  (1.676 m)   Body mass index is 48.74 kg/m.   General appearance:  Normal Thyroid:  Symmetrical, normal in size, without palpable masses or nodularity. Respiratory  Auscultation:  Clear without wheezing or rhonchi Cardiovascular  Auscultation:  Regular rate, without rubs, murmurs or gallops  Edema/varicosities:  Not grossly evident Abdominal  Soft,nontender, without masses, guarding or rebound.  Liver/spleen:  No organomegaly noted  Hernia:  None appreciated  Skin  Inspection:  Grossly normal   Breasts: Examined lying and sitting.     Right: Without masses, retractions, discharge or axillary adenopathy.     Left: Without masses, retractions, discharge or axillary adenopathy. Gentitourinary   Inguinal/mons:  Normal  without inguinal adenopathy  External genitalia:  Normal  BUS/Urethra/Skene's glands:  Normal  Vagina:  Normal  Cervix:  Normal  Uterus:  Enlarged, non-tender.  Midline and mobile limited exam abdominal girth  Adnexa/parametria:     Rt: Without masses or tenderness.   Lt: Without masses or tenderness.  Anus and perineum: Normal  Digital rectal exam: Normal sphincter tone without palpated masses or tenderness  Assessment/Plan:  55 y.o.  SBF G0 for annual exam.  HTN/prediabetes -- PCP manages labs and meds Obesity History of pituitary microadenoma Menopausal/amenorrheic/no HRT Mammogram past due  Fibroids  Plan: Reviewed importance of healthy, reduced calorie, low-carb, low-sugar diet to prevent diabetes, continue monitoring with PCP. Daily exercise, SBEs, multivitamin, screening mammograms encouraged. Overdue for yearly mammogram, plans to schedule next month. Encouraged condom use if becoming sexually active. Recommended flu vaccine. Ultrasound, Prolactin, FSH, Pap, with HR HPV typing ,Vit. [name], 8:48 AM 08/02/2016

## 2016-08-24 ENCOUNTER — Ambulatory Visit: Payer: 59 | Admitting: Women's Health

## 2016-08-24 ENCOUNTER — Other Ambulatory Visit: Payer: 59

## 2016-09-12 ENCOUNTER — Other Ambulatory Visit: Payer: 59

## 2016-09-12 ENCOUNTER — Other Ambulatory Visit: Payer: Self-pay | Admitting: Women's Health

## 2016-09-12 ENCOUNTER — Ambulatory Visit: Payer: 59 | Admitting: Women's Health

## 2016-09-12 MED ORDER — MEDROXYPROGESTERONE ACETATE 10 MG PO TABS: ORAL_TABLET | ORAL | 0 refills | Status: DC

## 2016-09-12 MED ORDER — VITAMIN D (ERGOCALCIFEROL) 1.25 MG (50000 UNIT) PO CAPS: 50000.0000 [IU] | ORAL_CAPSULE | ORAL | 1 refills | Status: DC

## 2016-10-31 ENCOUNTER — Other Ambulatory Visit: Payer: Self-pay | Admitting: Radiology

## 2016-11-05 ENCOUNTER — Encounter: Payer: Self-pay | Admitting: Women's Health

## 2017-04-03 ENCOUNTER — Encounter: Payer: Self-pay | Admitting: Gynecology

## 2017-08-27 ENCOUNTER — Encounter: Payer: Self-pay | Admitting: Women's Health

## 2017-08-27 ENCOUNTER — Ambulatory Visit (INDEPENDENT_AMBULATORY_CARE_PROVIDER_SITE_OTHER): Payer: 59 | Admitting: Women's Health

## 2017-08-27 VITALS — BP 130/84 | Ht 67.0 in | Wt 291.0 lb

## 2017-08-27 DIAGNOSIS — Z23 Encounter for immunization: Secondary | ICD-10-CM | POA: Diagnosis not present

## 2017-08-27 DIAGNOSIS — N898 Other specified noninflammatory disorders of vagina: Secondary | ICD-10-CM | POA: Diagnosis not present

## 2017-08-27 DIAGNOSIS — Z01419 Encounter for gynecological examination (general) (routine) without abnormal findings: Secondary | ICD-10-CM

## 2017-08-27 DIAGNOSIS — B373 Candidiasis of vulva and vagina: Secondary | ICD-10-CM

## 2017-08-27 DIAGNOSIS — B3731 Acute candidiasis of vulva and vagina: Secondary | ICD-10-CM

## 2017-08-27 LAB — WET PREP FOR TRICH, YEAST, CLUE

## 2017-08-27 MED ORDER — FLUCONAZOLE 150 MG PO TABS: 150.0000 mg | ORAL_TABLET | Freq: Once | ORAL | 1 refills | Status: AC

## 2017-08-27 NOTE — Patient Instructions (Signed)
Health Maintenance for Postmenopausal Women Menopause is a normal process in which your reproductive ability comes to an end. This process happens gradually over a span of months to years, usually between the ages of 22 and 9. Menopause is complete when you have missed 12 consecutive menstrual periods. It is important to talk with your health care provider about some of the most common conditions that affect postmenopausal women, such as heart disease, cancer, and bone loss (osteoporosis). Adopting a healthy lifestyle and getting preventive care can help to promote your health and wellness. Those actions can also lower your chances of developing some of these common conditions. What should I know about menopause? During menopause, you may experience a number of symptoms, such as:  Moderate-to-severe hot flashes.  Night sweats.  Decrease in sex drive.  Mood swings.  Headaches.  Tiredness.  Irritability.  Memory problems.  Insomnia.  Choosing to treat or not to treat menopausal changes is an individual decision that you make with your health care provider. What should I know about hormone replacement therapy and supplements? Hormone therapy products are effective for treating symptoms that are associated with menopause, such as hot flashes and night sweats. Hormone replacement carries certain risks, especially as you become older. If you are thinking about using estrogen or estrogen with progestin treatments, discuss the benefits and risks with your health care provider. What should I know about heart disease and stroke? Heart disease, heart attack, and stroke become more likely as you age. This may be due, in part, to the hormonal changes that your body experiences during menopause. These can affect how your body processes dietary fats, triglycerides, and cholesterol. Heart attack and stroke are both medical emergencies. There are many things that you can do to help prevent heart disease  and stroke:  Have your blood pressure checked at least every 1-2 years. High blood pressure causes heart disease and increases the risk of stroke.  If you are 53-22 years old, ask your health care provider if you should take aspirin to prevent a heart attack or a stroke.  Do not use any tobacco products, including cigarettes, chewing tobacco, or electronic cigarettes. If you need help quitting, ask your health care provider.  It is important to eat a healthy diet and maintain a healthy weight. ? Be sure to include plenty of vegetables, fruits, low-fat dairy products, and lean protein. ? Avoid eating foods that are high in solid fats, added sugars, or salt (sodium).  Get regular exercise. This is one of the most important things that you can do for your health. ? Try to exercise for at least 150 minutes each week. The type of exercise that you do should increase your heart rate and make you sweat. This is known as moderate-intensity exercise. ? Try to do strengthening exercises at least twice each week. Do these in addition to the moderate-intensity exercise.  Know your numbers.Ask your health care provider to check your cholesterol and your blood glucose. Continue to have your blood tested as directed by your health care provider.  What should I know about cancer screening? There are several types of cancer. Take the following steps to reduce your risk and to catch any cancer development as early as possible. Breast Cancer  Practice breast self-awareness. ? This means understanding how your breasts normally appear and feel. ? It also means doing regular breast self-exams. Let your health care provider know about any changes, no matter how small.  If you are 40  or older, have a clinician do a breast exam (clinical breast exam or CBE) every year. Depending on your age, family history, and medical history, it may be recommended that you also have a yearly breast X-ray (mammogram).  If you  have a family history of breast cancer, talk with your health care provider about genetic screening.  If you are at high risk for breast cancer, talk with your health care provider about having an MRI and a mammogram every year.  Breast cancer (BRCA) gene test is recommended for women who have family members with BRCA-related cancers. Results of the assessment will determine the need for genetic counseling and BRCA1 and for BRCA2 testing. BRCA-related cancers include these types: ? Breast. This occurs in males or females. ? Ovarian. ? Tubal. This may also be called fallopian tube cancer. ? Cancer of the abdominal or pelvic lining (peritoneal cancer). ? Prostate. ? Pancreatic.  Cervical, Uterine, and Ovarian Cancer Your health care provider may recommend that you be screened regularly for cancer of the pelvic organs. These include your ovaries, uterus, and vagina. This screening involves a pelvic exam, which includes checking for microscopic changes to the surface of your cervix (Pap test).  For women ages 21-65, health care providers may recommend a pelvic exam and a Pap test every three years. For women ages 79-65, they may recommend the Pap test and pelvic exam, combined with testing for human papilloma virus (HPV), every five years. Some types of HPV increase your risk of cervical cancer. Testing for HPV may also be done on women of any age who have unclear Pap test results.  Other health care providers may not recommend any screening for nonpregnant women who are considered low risk for pelvic cancer and have no symptoms. Ask your health care provider if a screening pelvic exam is right for you.  If you have had past treatment for cervical cancer or a condition that could lead to cancer, you need Pap tests and screening for cancer for at least 20 years after your treatment. If Pap tests have been discontinued for you, your risk factors (such as having a new sexual partner) need to be  reassessed to determine if you should start having screenings again. Some women have medical problems that increase the chance of getting cervical cancer. In these cases, your health care provider may recommend that you have screening and Pap tests more often.  If you have a family history of uterine cancer or ovarian cancer, talk with your health care provider about genetic screening.  If you have vaginal bleeding after reaching menopause, tell your health care provider.  There are currently no reliable tests available to screen for ovarian cancer.  Lung Cancer Lung cancer screening is recommended for adults 69-62 years old who are at high risk for lung cancer because of a history of smoking. A yearly low-dose CT scan of the lungs is recommended if you:  Currently smoke.  Have a history of at least 30 pack-years of smoking and you currently smoke or have quit within the past 15 years. A pack-year is smoking an average of one pack of cigarettes per day for one year.  Yearly screening should:  Continue until it has been 15 years since you quit.  Stop if you develop a health problem that would prevent you from having lung cancer treatment.  Colorectal Cancer  This type of cancer can be detected and can often be prevented.  Routine colorectal cancer screening usually begins at  age 42 and continues through age 45.  If you have risk factors for colon cancer, your health care provider may recommend that you be screened at an earlier age.  If you have a family history of colorectal cancer, talk with your health care provider about genetic screening.  Your health care provider may also recommend using home test kits to check for hidden blood in your stool.  A small camera at the end of a tube can be used to examine your colon directly (sigmoidoscopy or colonoscopy). This is done to check for the earliest forms of colorectal cancer.  Direct examination of the colon should be repeated every  5-10 years until age 71. However, if early forms of precancerous polyps or small growths are found or if you have a family history or genetic risk for colorectal cancer, you may need to be screened more often.  Skin Cancer  Check your skin from head to toe regularly.  Monitor any moles. Be sure to tell your health care provider: ? About any new moles or changes in moles, especially if there is a change in a mole's shape or color. ? If you have a mole that is larger than the size of a pencil eraser.  If any of your family members has a history of skin cancer, especially at a [place] age, talk with your health care provider about genetic screening.  Always use sunscreen. Apply sunscreen liberally and repeatedly throughout the day.  Whenever you are outside, protect yourself by wearing long sleeves, pants, a wide-brimmed hat, and sunglasses.  What should I know about osteoporosis? Osteoporosis is a condition in which bone destruction happens more quickly than new bone creation. After menopause, you may be at an increased risk for osteoporosis. To help prevent osteoporosis or the bone fractures that can happen because of osteoporosis, the following is recommended:  If you are 46-71 years old, get at least 1,000 mg of calcium and at least 600 mg of vitamin D per day.  If you are older than age 55 but younger than age 65, get at least 1,200 mg of calcium and at least 600 mg of vitamin D per day.  If you are older than age 54, get at least 1,200 mg of calcium and at least 800 mg of vitamin D per day.  Smoking and excessive alcohol intake increase the risk of osteoporosis. Eat foods that are rich in calcium and vitamin D, and do weight-bearing exercises several times each week as directed by your health care provider. What should I know about how menopause affects my mental health? Depression may occur at any age, but it is more common as you become older. Common symptoms of depression  include:  Low or sad mood.  Changes in sleep patterns.  Changes in appetite or eating patterns.  Feeling an overall lack of motivation or enjoyment of activities that you previously enjoyed.  Frequent crying spells.  Talk with your health care provider if you think that you are experiencing depression. What should I know about immunizations? It is important that you get and maintain your immunizations. These include:  Tetanus, diphtheria, and pertussis (Tdap) booster vaccine.  Influenza every year before the flu season begins.  Pneumonia vaccine.  Shingles vaccine.  Your health care provider may also recommend other immunizations. This information is not intended to replace advice given to you by your health care provider. Make sure you discuss any questions you have with your health care provider. Document Released: 12/28/2005  Document Revised: 05/25/2016 Document Reviewed: 08/09/2015 Elsevier Interactive Patient Education  2018 [employer]  Vaginal Yeast infection, Adult Vaginal yeast infection is a condition that causes soreness, swelling, and redness (inflammation) of the vagina. It also causes vaginal discharge. This is a common condition. Some women get this infection frequently. What are the causes? This condition is caused by a change in the normal balance of the yeast (candida) and bacteria that live in the vagina. This change causes an overgrowth of yeast, which causes the inflammation. What increases the risk? This condition is more likely to develop in:  Women who take antibiotic medicines.  Women who have diabetes.  Women who take birth control pills.  Women who are pregnant.  Women who douche often.  Women who have a weak defense (immune) system.  Women who have been taking steroid medicines for a long time.  Women who frequently wear tight clothing.  What are the signs or symptoms? Symptoms of this condition include:  White, thick vaginal  discharge.  Swelling, itching, redness, and irritation of the vagina. The lips of the vagina (vulva) may be affected as well.  Pain or a burning feeling while urinating.  Pain during sex.  How is this diagnosed? This condition is diagnosed with a medical history and physical exam. This will include a pelvic exam. Your health care provider will examine a sample of your vaginal discharge under a microscope. Your health care provider may send this sample for testing to confirm the diagnosis. How is this treated? This condition is treated with medicine. Medicines may be over-the-counter or prescription. You may be told to use one or more of the following:  Medicine that is taken orally.  Medicine that is applied as a cream.  Medicine that is inserted directly into the vagina (suppository).  Follow these instructions at home:  Take or apply over-the-counter and prescription medicines only as told by your health care provider.  Do not have sex until your health care provider has approved. Tell your sex partner that you have a yeast infection. That person should go to his or her health care provider if he or she develops symptoms.  Do not wear tight clothes, such as pantyhose or tight pants.  Avoid using tampons until your health care provider approves.  Eat more yogurt. This may help to keep your yeast infection from returning.  Try taking a sitz bath to help with discomfort. This is a warm water bath that is taken while you are sitting down. The water should only come up to your hips and should cover your buttocks. Do this 3-4 times per day or as told by your health care provider.  Do not douche.  Wear breathable, cotton underwear.  If you have diabetes, keep your blood sugar levels under control. Contact a health care provider if:  You have a fever.  Your symptoms go away and then return.  Your symptoms do not get better with treatment.  Your symptoms get worse.  You have  new symptoms.  You develop blisters in or around your vagina.  You have blood coming from your vagina and it is not your menstrual period.  You develop pain in your abdomen. This information is not intended to replace advice given to you by your health care provider. Make sure you discuss any questions you have with your health care provider. Document Released: 08/15/2005 Document Revised: 04/18/2016 Document Reviewed: 05/09/2015 Elsevier Interactive Patient Education  2018 [employer]  

## 2017-08-27 NOTE — Progress Notes (Signed)
Lindsey Norton 02/01/1961 56    History:    Presents for annual exam.  Is menopausal on no HRT and no bleeding greater than one year. Normal Pap and mammogram history. 2009 negative colonoscopy. 2013 shingles. Primary care manages hypertension and prediabetes. Not sexually active.  Past medical history, past surgical history, family history and social history were all reviewed and documented in the EPIC chart. Works at [employer] in [place]. Mother stroke living in a nursing home. Father died from kidney and spine cancer.  ROS:  A ROS was performed and pertinent positives and negatives are included.  Exam:  Vitals:   08/27/17 1625  BP: 130/84  Weight: 291 lb (132 kg)  Height: 5\' 7"  (1.702 m)   Body mass index is 45.58 kg/m.   General appearance:  Normal Thyroid:  Symmetrical, normal in size, without palpable masses or nodularity. Respiratory  Auscultation:  Clear without wheezing or rhonchi Cardiovascular  Auscultation:  Regular rate, without rubs, murmurs or gallops  Edema/varicosities:  Not grossly evident Abdominal  Soft,nontender, without masses, guarding or rebound.  Liver/spleen:  No organomegaly noted  Hernia:  None appreciated  Skin  Inspection:  Grossly normal   Breasts: Examined lying and sitting.     Right: Without masses, retractions, discharge or axillary adenopathy.     Left: Without masses, retractions, discharge or axillary adenopathy. Gentitourinary   Inguinal/mons:  Normal without inguinal adenopathy  External genitalia:  Normal  BUS/Urethra/Skene's glands:  Normal  Vagina:  White discharge, wet prep positive for yeast  Cervix:  Normal  Uterus:  normal in size, shape and contour.  Midline and mobile  Adnexa/parametria:     Rt: Without masses or tenderness.   Lt: Without masses or tenderness.  Anus and perineum: Normal  Digital rectal exam: Normal sphincter tone without palpated masses or tenderness  Assessment/Plan:  56 y.o. SBF G0  for  annual exam with occasional vaginal itching.Marland Kitchen  Postmenopausal on no HRT with no bleeding Yeast vaginitis Hypertension/prediabetes-primary care manages labs and meds Morbid obesity  Plan: Diflucan 150 by mouth 1 dose, yeast prevention discussed. SBE's, continue annual screening mammogram, calcium rich diet, vitamin D 2000 daily encouraged. Reviewed importance of continuing to increase regular exercise, decrease calories and carbs for weight loss encouraged. Pap normal 2017, new screening guidelines reviewed.    [place], 4:59 PM 08/27/2017

## 2017-12-27 DIAGNOSIS — R739 Hyperglycemia, unspecified: Secondary | ICD-10-CM | POA: Diagnosis not present

## 2017-12-27 DIAGNOSIS — E7800 Pure hypercholesterolemia, unspecified: Secondary | ICD-10-CM | POA: Diagnosis not present

## 2017-12-27 DIAGNOSIS — E669 Obesity, unspecified: Secondary | ICD-10-CM | POA: Diagnosis not present

## 2017-12-27 DIAGNOSIS — I10 Essential (primary) hypertension: Secondary | ICD-10-CM | POA: Diagnosis not present

## 2018-03-11 DIAGNOSIS — J040 Acute laryngitis: Secondary | ICD-10-CM | POA: Diagnosis not present

## 2018-03-11 DIAGNOSIS — J069 Acute upper respiratory infection, unspecified: Secondary | ICD-10-CM | POA: Diagnosis not present

## 2018-05-02 DIAGNOSIS — R739 Hyperglycemia, unspecified: Secondary | ICD-10-CM | POA: Diagnosis not present

## 2018-05-02 DIAGNOSIS — E7800 Pure hypercholesterolemia, unspecified: Secondary | ICD-10-CM | POA: Diagnosis not present

## 2018-05-02 DIAGNOSIS — I10 Essential (primary) hypertension: Secondary | ICD-10-CM | POA: Diagnosis not present

## 2018-05-02 DIAGNOSIS — E559 Vitamin D deficiency, unspecified: Secondary | ICD-10-CM | POA: Diagnosis not present

## 2018-08-12 DIAGNOSIS — R739 Hyperglycemia, unspecified: Secondary | ICD-10-CM | POA: Diagnosis not present

## 2018-08-12 DIAGNOSIS — E559 Vitamin D deficiency, unspecified: Secondary | ICD-10-CM | POA: Diagnosis not present

## 2018-08-12 DIAGNOSIS — E7800 Pure hypercholesterolemia, unspecified: Secondary | ICD-10-CM | POA: Diagnosis not present

## 2018-08-15 DIAGNOSIS — Z23 Encounter for immunization: Secondary | ICD-10-CM | POA: Diagnosis not present

## 2018-08-15 DIAGNOSIS — E7800 Pure hypercholesterolemia, unspecified: Secondary | ICD-10-CM | POA: Diagnosis not present

## 2018-08-15 DIAGNOSIS — E669 Obesity, unspecified: Secondary | ICD-10-CM | POA: Diagnosis not present

## 2018-08-15 DIAGNOSIS — I10 Essential (primary) hypertension: Secondary | ICD-10-CM | POA: Diagnosis not present

## 2018-09-03 ENCOUNTER — Encounter: Payer: 59 | Admitting: Women's Health

## 2018-09-03 DIAGNOSIS — Z0289 Encounter for other administrative examinations: Secondary | ICD-10-CM

## 2018-11-01 DIAGNOSIS — Z1231 Encounter for screening mammogram for malignant neoplasm of breast: Secondary | ICD-10-CM | POA: Diagnosis not present

## 2018-11-07 DIAGNOSIS — M25562 Pain in left knee: Secondary | ICD-10-CM | POA: Diagnosis not present

## 2018-11-07 DIAGNOSIS — M25561 Pain in right knee: Secondary | ICD-10-CM | POA: Diagnosis not present

## 2018-11-07 DIAGNOSIS — M25571 Pain in right ankle and joints of right foot: Secondary | ICD-10-CM | POA: Diagnosis not present

## 2018-11-07 DIAGNOSIS — E669 Obesity, unspecified: Secondary | ICD-10-CM | POA: Diagnosis not present

## 2018-11-27 DIAGNOSIS — N6312 Unspecified lump in the right breast, upper inner quadrant: Secondary | ICD-10-CM | POA: Diagnosis not present

## 2018-11-27 DIAGNOSIS — N6001 Solitary cyst of right breast: Secondary | ICD-10-CM | POA: Diagnosis not present

## 2018-12-10 ENCOUNTER — Encounter: Payer: Self-pay | Admitting: Women's Health

## 2018-12-25 DIAGNOSIS — J101 Influenza due to other identified influenza virus with other respiratory manifestations: Secondary | ICD-10-CM | POA: Diagnosis not present

## 2018-12-25 DIAGNOSIS — R509 Fever, unspecified: Secondary | ICD-10-CM | POA: Diagnosis not present

## 2019-01-14 ENCOUNTER — Ambulatory Visit (INDEPENDENT_AMBULATORY_CARE_PROVIDER_SITE_OTHER): Payer: BLUE CROSS/BLUE SHIELD | Admitting: Women's Health

## 2019-01-14 ENCOUNTER — Encounter: Payer: Self-pay | Admitting: Women's Health

## 2019-01-14 VITALS — BP 124/82 | Wt 289.0 lb

## 2019-01-14 DIAGNOSIS — Z01419 Encounter for gynecological examination (general) (routine) without abnormal findings: Secondary | ICD-10-CM

## 2019-01-14 DIAGNOSIS — N950 Postmenopausal bleeding: Secondary | ICD-10-CM | POA: Diagnosis not present

## 2019-01-14 NOTE — Patient Instructions (Addendum)
Health Maintenance for Postmenopausal Women Menopause is a normal process in which your reproductive ability comes to an end. This process happens gradually over a span of months to years, usually between the ages of 62 and 89. Menopause is complete when you have missed 12 consecutive menstrual periods. It is important to talk with your health care provider about some of the most common conditions that affect postmenopausal women, such as heart disease, cancer, and bone loss (osteoporosis). Adopting a healthy lifestyle and getting preventive care can help to promote your health and wellness. Those actions can also lower your chances of developing some of these common conditions. What should I know about menopause? During menopause, you may experience a number of symptoms, such as:  Moderate-to-severe hot flashes.  Night sweats.  Decrease in sex drive.  Mood swings.  Headaches.  Tiredness.  Irritability.  Memory problems.  Insomnia. Choosing to treat or not to treat menopausal changes is an individual decision that you make with your health care provider. What should I know about hormone replacement therapy and supplements? Hormone therapy products are effective for treating symptoms that are associated with menopause, such as hot flashes and night sweats. Hormone replacement carries certain risks, especially as you become older. If you are thinking about using estrogen or estrogen with progestin treatments, discuss the benefits and risks with your health care provider. What should I know about heart disease and stroke? Heart disease, heart attack, and stroke become more likely as you age. This may be due, in part, to the hormonal changes that your body experiences during menopause. These can affect how your body processes dietary fats, triglycerides, and cholesterol. Heart attack and stroke are both medical emergencies. There are many things that you can do to help prevent heart disease  and stroke:  Have your blood pressure checked at least every 1-2 years. High blood pressure causes heart disease and increases the risk of stroke.  If you are 79-72 years old, ask your health care provider if you should take aspirin to prevent a heart attack or a stroke.  Do not use any tobacco products, including cigarettes, chewing tobacco, or electronic cigarettes. If you need help quitting, ask your health care provider.  It is important to eat a healthy diet and maintain a healthy weight. ? Be sure to include plenty of vegetables, fruits, low-fat dairy products, and lean protein. ? Avoid eating foods that are high in solid fats, added sugars, or salt (sodium).  Get regular exercise. This is one of the most important things that you can do for your health. ? Try to exercise for at least 150 minutes each week. The type of exercise that you do should increase your heart rate and make you sweat. This is known as moderate-intensity exercise. ? Try to do strengthening exercises at least twice each week. Do these in addition to the moderate-intensity exercise.  Know your numbers.Ask your health care provider to check your cholesterol and your blood glucose. Continue to have your blood tested as directed by your health care provider.  What should I know about cancer screening? There are several types of cancer. Take the following steps to reduce your risk and to catch any cancer development as early as possible. Breast Cancer  Practice breast self-awareness. ? This means understanding how your breasts normally appear and feel. ? It also means doing regular breast self-exams. Let your health care provider know about any changes, no matter how small.  If you are 40 or  older, have a clinician do a breast exam (clinical breast exam or CBE) every year. Depending on your age, family history, and medical history, it may be recommended that you also have a yearly breast X-ray (mammogram).  If you  have a family history of breast cancer, talk with your health care provider about genetic screening.  If you are at high risk for breast cancer, talk with your health care provider about having an MRI and a mammogram every year.  Breast cancer (BRCA) gene test is recommended for women who have family members with BRCA-related cancers. Results of the assessment will determine the need for genetic counseling and BRCA1 and for BRCA2 testing. BRCA-related cancers include these types: ? Breast. This occurs in males or females. ? Ovarian. ? Tubal. This may also be called fallopian tube cancer. ? Cancer of the abdominal or pelvic lining (peritoneal cancer). ? Prostate. ? Pancreatic. Cervical, Uterine, and Ovarian Cancer Your health care provider may recommend that you be screened regularly for cancer of the pelvic organs. These include your ovaries, uterus, and vagina. This screening involves a pelvic exam, which includes checking for microscopic changes to the surface of your cervix (Pap test).  For women ages 21-65, health care providers may recommend a pelvic exam and a Pap test every three years. For women ages 39-65, they may recommend the Pap test and pelvic exam, combined with testing for human papilloma virus (HPV), every five years. Some types of HPV increase your risk of cervical cancer. Testing for HPV may also be done on women of any age who have unclear Pap test results.  Other health care providers may not recommend any screening for nonpregnant women who are considered low risk for pelvic cancer and have no symptoms. Ask your health care provider if a screening pelvic exam is right for you.  If you have had past treatment for cervical cancer or a condition that could lead to cancer, you need Pap tests and screening for cancer for at least 20 years after your treatment. If Pap tests have been discontinued for you, your risk factors (such as having a new sexual partner) need to be reassessed  to determine if you should start having screenings again. Some women have medical problems that increase the chance of getting cervical cancer. In these cases, your health care provider may recommend that you have screening and Pap tests more often.  If you have a family history of uterine cancer or ovarian cancer, talk with your health care provider about genetic screening.  If you have vaginal bleeding after reaching menopause, tell your health care provider.  There are currently no reliable tests available to screen for ovarian cancer. Lung Cancer Lung cancer screening is recommended for adults 57-50 years old who are at high risk for lung cancer because of a history of smoking. A yearly low-dose CT scan of the lungs is recommended if you:  Currently smoke.  Have a history of at least 30 pack-years of smoking and you currently smoke or have quit within the past 15 years. A pack-year is smoking an average of one pack of cigarettes per day for one year. Yearly screening should:  Continue until it has been 15 years since you quit.  Stop if you develop a health problem that would prevent you from having lung cancer treatment. Colorectal Cancer  This type of cancer can be detected and can often be prevented.  Routine colorectal cancer screening usually begins at age 12 and continues through  age 75.  If you have risk factors for colon cancer, your health care provider may recommend that you be screened at an earlier age.  If you have a family history of colorectal cancer, talk with your health care provider about genetic screening.  Your health care provider may also recommend using home test kits to check for hidden blood in your stool.  A small camera at the end of a tube can be used to examine your colon directly (sigmoidoscopy or colonoscopy). This is done to check for the earliest forms of colorectal cancer.  Direct examination of the colon should be repeated every 5-10 years until  age 75. However, if early forms of precancerous polyps or small growths are found or if you have a family history or genetic risk for colorectal cancer, you may need to be screened more often. Skin Cancer  Check your skin from head to toe regularly.  Monitor any moles. Be sure to tell your health care provider: ? About any new moles or changes in moles, especially if there is a change in a mole's shape or color. ? If you have a mole that is larger than the size of a pencil eraser.  If any of your family members has a history of skin cancer, especially at a young age, talk with your health care provider about genetic screening.  Always use sunscreen. Apply sunscreen liberally and repeatedly throughout the day.  Whenever you are outside, protect yourself by wearing long sleeves, pants, a wide-brimmed hat, and sunglasses. What should I know about osteoporosis? Osteoporosis is a condition in which bone destruction happens more quickly than new bone creation. After menopause, you may be at an increased risk for osteoporosis. To help prevent osteoporosis or the bone fractures that can happen because of osteoporosis, the following is recommended:  If you are 19-50 years old, get at least 1,000 mg of calcium and at least 600 mg of vitamin D per day.  If you are older than age 50 but younger than age 70, get at least 1,200 mg of calcium and at least 600 mg of vitamin D per day.  If you are older than age 70, get at least 1,200 mg of calcium and at least 800 mg of vitamin D per day. Smoking and excessive alcohol intake increase the risk of osteoporosis. Eat foods that are rich in calcium and vitamin D, and do weight-bearing exercises several times each week as directed by your health care provider. What should I know about how menopause affects my mental health? Depression may occur at any age, but it is more common as you become older. Common symptoms of depression include:  Low or sad  mood.  Changes in sleep patterns.  Changes in appetite or eating patterns.  Feeling an overall lack of motivation or enjoyment of activities that you previously enjoyed.  Frequent crying spells. Talk with your health care provider if you think that you are experiencing depression. What should I know about immunizations? It is important that you get and maintain your immunizations. These include:  Tetanus, diphtheria, and pertussis (Tdap) booster vaccine.  Influenza every year before the flu season begins.  Pneumonia vaccine.  Shingles vaccine. Your health care provider may also recommend other immunizations. This information is not intended to replace advice given to you by your health care provider. Make sure you discuss any questions you have with your health care provider. Document Released: 12/28/2005 Document Revised: 05/25/2016 Document Reviewed: 08/09/2015 Elsevier Interactive Patient Education    2019 [place] is a procedure to examine the inside of the uterus. This exam uses sound waves that are sent to a computer to make images of the lining of the uterus (endometrium). To get the best images, a germ-free, salt-water solution (sterile saline) is put into the uterus through the vagina. You may have this procedure if you have certain reproductive problems, such as abnormal bleeding, infertility, or miscarriage. This procedure can show what may be causing these problems. Possible causes include scarring or abnormal growths such as fibroids inside your uterus. It can also show if your uterus is an abnormal shape or if the lining of the uterus is too thin. Tell a health care provider about:  All medicines you are taking, including vitamins, herbs, eye drops, creams, and over-the-counter medicines.  Any allergies you have.  Any blood disorders you have.  Any surgeries you have had.  Any medical conditions you have.  Whether you are  pregnant or may be pregnant.  The date of the first day of your last period.  Any signs of infection, such as fever, pain in your lower abdomen, or abnormal discharge from your vagina. What are the risks? Generally, this is a safe procedure. However, problems may occur, including:  Abdominal pain or cramping.  Light bleeding (spotting).  Increased vaginal discharge.  Infection. What happens before the procedure?  Your health care provider may have you take an over-the-counter pain medicine.  You may be given medicine to stop any abnormal bleeding.  You may be given antibiotic medicine to help prevent infection.  You may be asked to take a pregnancy test. This is usually in the form of a urine test.  You may have a pelvic exam.  You will be asked to empty your bladder. What happens during the procedure?  You will lie down on the exam table with your feet in stirrups or with your knees bent and your feet flat on the table.  A slender, handheld device (transducer) will be lubricated and placed into your vagina.  The transducer will be positioned to send sound waves to your uterus. The sound waves are sent to a computer and are turned into images, which your health care provider sees during the procedure.  The transducer will be removed from your vagina.  An instrument will be inserted to widen the opening of your vagina (speculum).  A swab with germ-killing solution (antiseptic) will be used to clean the opening to your uterus (cervix).  A long, thin tube (catheter) will be placed through your cervix into your uterus.  The speculum will be removed.  The transducer will be placed back into your vagina to take more images.  Your uterus will be filled with a germ-free, salt-water solution (sterile saline) through the catheter. You may feel some cramping.  A fluid that contains air bubbles may be sent through the catheter to make it easier to see the fallopian tubes.  The  transducer and catheter will be removed. The procedure may vary among health care providers and hospitals. What happens after the procedure?  It is up to you to get the results of your procedure. Ask your health care provider, or the department that is doing the procedure, when your results will be ready. Summary  A sonohysterogram is a procedure that creates images of the inside of the uterus.  The risks of this procedure are very low. Most women experience cramping and spotting after the procedure.  You may need to have a pelvic exam and take a pregnancy test before this procedure. This procedure will not be done if you are pregnant or have an infection. This information is not intended to replace advice given to you by your health care provider. Make sure you discuss any questions you have with your health care provider. Document Released: 03/22/2014 Document Revised: 10/01/2016 Document Reviewed: 10/01/2016 Elsevier Interactive Patient Education  2019 [employer] Inc[employer]  Postmenopausal Bleeding  Postmenopausal bleeding is any bleeding that a woman has after she has entered into menopause. Menopause is the end of a woman's fertile years. After menopause, a woman no longer ovulates and does not have menstrual periods. Postmenopausal bleeding may have various causes, including:  Menopausal hormone therapy (MHT).  Endometrial atrophy. After menopause, low estrogen hormone levels cause the membrane that lines the uterus (endometrium) to become thinner. You may have bleeding as the endometrium thins.  Endometrial hyperplasia. This condition is caused by excess estrogen hormones and low levels of progesterone hormones. The excess estrogen causes the endometrium to thicken, which can lead to bleeding. In some cases, this can lead to cancer of the uterus.  Endometrial cancer.  Non-cancerous growths (polyps) on the endometrium, the lining of the uterus, or the cervix.  Uterine fibroids. These  are non-cancerous growths in or around the uterus muscle tissue that can cause heavy bleeding. Any type of postmenopausal bleeding, even if it appears to be a typical menstrual period, should be evaluated by your health care provider. Treatment will depend on the cause of the bleeding. Follow these instructions at home:  Pay attention to any changes in your symptoms.  Avoid using tampons and douches as told by your health care provider.  Change your pads regularly.  Get regular pelvic exams and Pap tests.  Take iron supplements as told by your health care provider.  Take over-the-counter and prescription medicines only as told by your health care provider.  Keep all follow-up visits as told by your health care provider. This is important. Contact a health care provider if:  Your bleeding lasts more than 1 week.  You have abdominal pain.  You have bleeding with or after sexual intercourse.  You have bleeding that happens more often than every 3 weeks. Get help right away if:  You have a fever, chills, headache, dizziness, muscle aches, and bleeding.  You have severe pain with bleeding.  You are passing blood clots.  You have heavy bleeding, need more than 1 pad an hour, and have never experienced this before.  You feel faint. Summary  Postmenopausal bleeding is any bleeding that a woman has after she has entered into menopause.  Postmenopausal bleeding may have various causes. Treatment will depend on the cause of the bleeding.  Any type of postmenopausal bleeding, even if it appears to be a typical menstrual period, should be evaluated by your health care provider.  Be sure to pay attention to any changes in your symptoms and keep all follow-up visits as told by your health care provider. This information is not intended to replace advice given to you by your health care provider. Make sure you discuss any questions you have with your health care provider. Document  Released: 02/13/2006 Document Revised: 01/29/2017 Document Reviewed: 01/29/2017 Elsevier Interactive Patient Education  2019 [employer].

## 2019-01-14 NOTE — Progress Notes (Signed)
Lindsey Norton 22-Sep-1961 [id number]    History:    Presents for annual exam.  Postmenopausal on no HRT with no bleeding for greater than 2 years and had a spontaneous cycle in both January and February each lasting approximately 5 days.  Not sexually active in years..  Normal Pap and mammogram history.  Negative breast biopsy 2017.  Primary care manages hypertension and at risk for diabetes.  2009- colonoscopy.  Past medical history, past surgical history, family history and social history were all reviewed and documented in the EPIC chart.  Works at [employer] in [place].  Mother diabetes, father hypertension.  ROS:  A ROS was performed and pertinent positives and negatives are included.  Exam:  Vitals:   01/14/19 1600  BP: 124/82  Weight: 289 lb (131.1 kg)   Body mass index is 45.26 kg/m.   General appearance:  Normal Thyroid:  Symmetrical, normal in size, without palpable masses or nodularity. Respiratory  Auscultation:  Clear without wheezing or rhonchi Cardiovascular  Auscultation:  Regular rate, without rubs, murmurs or gallops  Edema/varicosities:  Not grossly evident Abdominal  Soft,nontender, without masses, guarding or rebound.  Liver/spleen:  No organomegaly noted  Hernia:  None appreciated  Skin  Inspection:  Grossly normal, large keloid on upper left shoulder and mid sternum   Breasts: Examined lying and sitting.     Right: Without masses, retractions, discharge or axillary adenopathy.     Left: Without masses, retractions, discharge or axillary adenopathy. Gentitourinary   Inguinal/mons:  Normal without inguinal adenopathy  External genitalia:  Normal  BUS/Urethra/Skene's glands:  Normal  Vagina:  Normal  Cervix:  Normal  Uterus: Enlarged 14-week size fibroid uterus,  Midline and mobile difficult to ascertain size due to abdominal girth  Adnexa/parametria:     Rt: Without masses or tenderness.   Lt: Without masses or tenderness.  Anus and  perineum: Normal  Digital rectal exam: Normal sphincter tone without palpated masses or tenderness  Assessment/Plan:  58 y.o. SBF G0 for annual exam.     Postmenopausal x2 years with new onset bleeding Fibroid uterus Hypertension-primary care manages labs and meds Morbid obesity  Plan: Schedule sonohysterogram with Dr. Phineas Real.  Aware it is not normal to bleed after menopause.  SBEs, annual screening mammogram has had at [facility], instructed to have report sent to our office.  Overdue for screening colonoscopy instructed to schedule.  Reviewed importance of increasing exercise and decreasing calorie/carbs, weight watchers encouraged.  Pap with HR HPV typing.  New screening guidelines reviewed.   [place], 4:48 PM 01/14/2019

## 2019-01-14 NOTE — Addendum Note (Signed)
Addended by: Lorine Bears on: 01/14/2019 04:53 PM   Modules accepted: Orders

## 2019-01-19 LAB — PAP IG W/ RFLX HPV ASCU: PAP SMEAR COMMENT: 0

## 2019-02-03 ENCOUNTER — Other Ambulatory Visit: Payer: Self-pay | Admitting: Gynecology

## 2019-02-03 DIAGNOSIS — N950 Postmenopausal bleeding: Secondary | ICD-10-CM

## 2019-02-10 ENCOUNTER — Other Ambulatory Visit: Payer: Self-pay

## 2019-02-12 ENCOUNTER — Other Ambulatory Visit: Payer: Self-pay

## 2019-02-12 ENCOUNTER — Other Ambulatory Visit: Payer: Self-pay | Admitting: Gynecology

## 2019-02-12 ENCOUNTER — Ambulatory Visit (INDEPENDENT_AMBULATORY_CARE_PROVIDER_SITE_OTHER): Payer: BLUE CROSS/BLUE SHIELD

## 2019-02-12 ENCOUNTER — Ambulatory Visit: Payer: BLUE CROSS/BLUE SHIELD | Admitting: Gynecology

## 2019-02-12 ENCOUNTER — Other Ambulatory Visit: Payer: BLUE CROSS/BLUE SHIELD

## 2019-02-12 ENCOUNTER — Encounter: Payer: Self-pay | Admitting: Gynecology

## 2019-02-12 VITALS — BP 130/82

## 2019-02-12 DIAGNOSIS — D252 Subserosal leiomyoma of uterus: Secondary | ICD-10-CM

## 2019-02-12 DIAGNOSIS — N840 Polyp of corpus uteri: Secondary | ICD-10-CM

## 2019-02-12 DIAGNOSIS — N852 Hypertrophy of uterus: Secondary | ICD-10-CM

## 2019-02-12 DIAGNOSIS — N950 Postmenopausal bleeding: Secondary | ICD-10-CM

## 2019-02-12 DIAGNOSIS — D259 Leiomyoma of uterus, unspecified: Secondary | ICD-10-CM

## 2019-02-12 NOTE — Patient Instructions (Addendum)
Office will call you with the biopsy results from the ultrasound.  Office will call you to schedule the hysteroscopy D&C surgery.  Due to the coronavirus pandemic there will be a slight delay in scheduling the surgery.

## 2019-02-12 NOTE — Progress Notes (Signed)
    CARLETHIA MESQUITA 04/07/1961 [id number]        58 y.o.  G0P0000 presents having recently seen [place] with a history of no bleeding for 2 to 3 years and then has had 2 bleeding episodes in January and February.  No significant pain with these.  Past medical history,surgical history, problem list, medications, allergies, family history and social history were all reviewed and documented in the EPIC chart.  Directed ROS with pertinent positives and negatives documented in the history of present illness/assessment and plan.  Exam: [place] assistant BP 130/82 General appearance:  Normal Abdomen soft nontender without masses guarding rebound Pelvic external BUS vagina with atrophic changes.  Cervix with atrophic changes.  Uterus difficult to palpate but no gross masses or tenderness on bimanual exam.  Ultrasound transvaginal and transabdominal shows uterus generous in size with small myoma measuring 14 x 11 x 12 mm.  Endometrial echo thickened at 14.5 mm.  Right ovary normal.  Left ovary not visualized.  Cul-de-sac negative.  Sonohysterogram performed, sterile technique, easy catheter introduction, good distention with large endometrial polyp 65 x 15 x 55 mm filling the cavity.  Endometrial sample taken.  Patient tolerated well.  Assessment/Plan:  58 y.o. G0P0000 with history of postmenopausal bleeding and large endometrial polyp.  Recommended we proceed with hysteroscopy D&C with resection of the endometrial polyp.  We discussed was involved with the procedure to include the instrumentation and the intraoperative course as well as postoperative recovery.  Risks to include bleeding, transfusion, infection, damage to internal organs including vagina cervix uterus, perforation with damage to bowel bladder ureters vessels nerves necessitating major exploratory reparative surgeries was all discussed with her.  She understands due to the coronavirus there will be a delay in scheduling her surgery  assuming that the endometrial biopsy does not show significant pathology.  She will read present for a preoperative appointment before this.    Anastasio Auerbach MD, 4:54 PM 02/12/2019

## 2019-02-25 ENCOUNTER — Encounter: Payer: Self-pay | Admitting: *Deleted

## 2019-03-02 ENCOUNTER — Encounter: Payer: Self-pay | Admitting: *Deleted

## 2019-03-02 NOTE — Progress Notes (Signed)
Mailing letter for surgery since didn't reply to [place]. KW CMA

## 2019-04-02 ENCOUNTER — Telehealth: Payer: Self-pay

## 2019-04-02 NOTE — Telephone Encounter (Signed)
Per DPR access note on file I left message for patient that we have resumed scheduling surgery. I left her the available dates in June and asked her to consider and call as soon as she can.

## 2019-04-15 ENCOUNTER — Telehealth: Payer: Self-pay

## 2019-04-15 NOTE — Telephone Encounter (Signed)
Patient informed. We discussed available dates. Patient needs to check with person who will bring her and she wants me to call her with July schedule to see if she can schedule for Friday July 17 block day.

## 2019-04-15 NOTE — Telephone Encounter (Signed)
Patient called in voice mail stating she is ready to schedule surgery (D&C, Hyst. w Myosure resection of polyp).  She wants to be sure you are going to remove her fibroid too.

## 2019-04-15 NOTE — Telephone Encounter (Signed)
We will remove the polyp or anything else that is within the endometrial cavity.  Any fibroids that are in the wall of the uterus will not be removed because it is not possible to do that hysteroscopically

## 2019-04-22 ENCOUNTER — Telehealth: Payer: Self-pay

## 2019-04-22 NOTE — Telephone Encounter (Signed)
Per DPR access I left patient a message that July schedule was open now and Dr. Loetta Rough is off on 06/05/19. She was waiting to see if he was available for that block Friday surgery date.  I am holding 05/12/19 for her and asked her to call me and let me know how to proceed.

## 2019-04-28 NOTE — Telephone Encounter (Signed)
Called again and left message that June 23rd block will be released today and I need her to call me asap and let me know if she wants to schedule that day.

## 2019-04-28 NOTE — Telephone Encounter (Signed)
Patient called me back and left voice mail that she would like to schedule for 05/12/2019. She asked me to go ahead and schedule her and she will call me back later to get details and instructions.  I went ahead and scheduled her for 6/23 at 10:15am at [facility].

## 2019-04-30 NOTE — Telephone Encounter (Signed)
I spoke with patient. We discussed her insurance benefits and her estimated surgery prepymt due by one week prior to surgery.  Patient asked about July dates and we discussed available dates.  She wants to consider her finances and talk with the person who brings her and she may need to change the date. She will call me tomorrow.

## 2019-04-30 NOTE — Telephone Encounter (Signed)
I called patient and left message that surgery time has changed to 7:30am. She called back in voice mail and confirmed that would be ok with person bringing her.  I am still waiting to talk with her to give her instructions and schedule pre op.

## 2019-05-04 MED ORDER — MISOPROSTOL 100 MCG PO TABS: ORAL_TABLET | ORAL | 0 refills | Status: DC

## 2019-05-04 NOTE — Telephone Encounter (Signed)
Patient called today and wants to re-schedule surgery to 06/01/19.  I will re-schedule her and be in touch with other appointments.

## 2019-05-04 NOTE — Telephone Encounter (Signed)
Spoke with patient and surgery rescheduled for 06/01/19. Dr. Loetta Rough notified by staff message of the cancellation of 6/23.  I scheduled her pre op appt 7/9 at noon and she will go over for Covid testing at 12:40 at [facility] thru.  I discussed the need for Cyotec tab the night before surgery and Rx was sent. I warned her she might see some spotting and/or bleeding and cramping that night she uses it but no worries just means it is working.  We had previously discussed her surgery prepymt due and she was reminded of that as well.

## 2019-05-07 ENCOUNTER — Encounter: Payer: Self-pay | Admitting: Gynecology

## 2019-05-13 ENCOUNTER — Telehealth: Payer: Self-pay

## 2019-05-13 ENCOUNTER — Other Ambulatory Visit: Payer: Self-pay | Admitting: Gynecology

## 2019-05-13 MED ORDER — MISOPROSTOL 100 MCG PO TABS: ORAL_TABLET | ORAL | 0 refills | Status: DC

## 2019-05-13 NOTE — Telephone Encounter (Signed)
Patient called because her [facility] pharmacy told her that Cytotec tab is on back order and they cannot fill it. I called around and found one at [facility] As that is close to the patient. Rx was sent there and I left patient a message letting her know.

## 2019-05-13 NOTE — Telephone Encounter (Signed)
I called patient and left message that her surgery start time has changed and is now 10am with check in 8am.  I will mail her another [facility] pamphlet with new time on it.

## 2019-05-15 DIAGNOSIS — R7303 Prediabetes: Secondary | ICD-10-CM | POA: Diagnosis not present

## 2019-05-15 DIAGNOSIS — I10 Essential (primary) hypertension: Secondary | ICD-10-CM | POA: Diagnosis not present

## 2019-05-15 DIAGNOSIS — E559 Vitamin D deficiency, unspecified: Secondary | ICD-10-CM | POA: Diagnosis not present

## 2019-05-15 DIAGNOSIS — E7800 Pure hypercholesterolemia, unspecified: Secondary | ICD-10-CM | POA: Diagnosis not present

## 2019-05-27 ENCOUNTER — Telehealth: Payer: Self-pay

## 2019-05-27 NOTE — Telephone Encounter (Signed)
Patient called asking me about emailing her a medical records release form so the disability company can send me forms to complete for her. I called her back and told her I am happy to do that but that the surgery she is having is usually a day or two recovery and most disability requires you be out for a certain length of time before they will begin to pay anything.  She said she did know that but when she talked with them they told her to go ahead and file disability just in case she had complications, etc.  I did explain to her that the medical release form will have to be on file before I fax the forms back to them and also the fee for the forms will have to be paid prior as well.  Med Records release form emailed to her.

## 2019-05-28 ENCOUNTER — Ambulatory Visit: Payer: BC Managed Care – PPO | Admitting: Gynecology

## 2019-05-28 ENCOUNTER — Other Ambulatory Visit: Payer: BC Managed Care – PPO

## 2019-05-28 ENCOUNTER — Encounter: Payer: Self-pay | Admitting: Gynecology

## 2019-05-28 ENCOUNTER — Other Ambulatory Visit (HOSPITAL_COMMUNITY): Payer: 59

## 2019-05-28 ENCOUNTER — Other Ambulatory Visit: Payer: Self-pay

## 2019-05-28 ENCOUNTER — Other Ambulatory Visit (HOSPITAL_COMMUNITY)
Admission: RE | Admit: 2019-05-28 | Discharge: 2019-05-28 | Disposition: A | Discharge status: Home / Self Care | Payer: BC Managed Care – PPO | Source: Ambulatory Visit | Attending: Gynecology | Admitting: Gynecology

## 2019-05-28 ENCOUNTER — Other Ambulatory Visit (HOSPITAL_COMMUNITY): Payer: Self-pay

## 2019-05-28 VITALS — BP 124/80

## 2019-05-28 DIAGNOSIS — Z1159 Encounter for screening for other viral diseases: Secondary | ICD-10-CM | POA: Diagnosis not present

## 2019-05-28 DIAGNOSIS — Z01812 Encounter for preprocedural laboratory examination: Secondary | ICD-10-CM | POA: Insufficient documentation

## 2019-05-28 DIAGNOSIS — N840 Polyp of corpus uteri: Secondary | ICD-10-CM

## 2019-05-28 DIAGNOSIS — N926 Irregular menstruation, unspecified: Secondary | ICD-10-CM

## 2019-05-28 DIAGNOSIS — Z0289 Encounter for other administrative examinations: Secondary | ICD-10-CM

## 2019-05-28 NOTE — Progress Notes (Signed)
    Lindsey Norton 10-05-1961 [id number]        58 y.o.  G0P0000 presents for her preoperative visit for upcoming hysteroscopy D&C with resection of endometrial polyp.  She has a history of no bleeding for several years and then had several bleeding episodes in January, February and subsequently the beginning of July.  Ultrasound showed uterus to be generous in size with small myoma measuring 14 mm.  Endometrial echo was thickened at 14.5 mm.  Right ovary normal.  Left ovary not visualized.  Sonohysterogram showed a large endometrial polyp measuring 65 x 15 x 55 mm filling the cavity.  Endometrial biopsy showed benign inactive endometrium.  Pap smear 11/2018 was normal  Past medical history,surgical history, problem list, medications, allergies, family history and social history were all reviewed and documented in the EPIC chart.  Directed ROS with pertinent positives and negatives documented in the history of present illness/assessment and plan.  Exam: [name] assistant Vitals:   05/28/19 1008  BP: 124/80   General appearance:  Normal HEENT normal Lungs clear Cardiac regular rate no rubs murmurs or gallops Abdomen soft nontender without masses guarding rebound Pelvic external BUS vagina with atrophic changes.  Cervix with atrophic changes.  Uterus difficult to palpate but no gross masses or tenderness.  Assessment/Plan:  58 y.o. G0P0000 with postmenopausal bleeding and sonohysterogram showing a large endometrial polyp.  Endometrial biopsy showed benign inactive endometrium.  The patient is scheduled for hysteroscopy D&C with resection of the endometrial polyp.  I reviewed the proposed surgery with the patient to include the expected intraoperative and postoperative courses as well as the recovery period. The use of the hysteroscope, resectoscope and the D&C portion were all discussed. The risks of surgery to include infection, prolonged antibiotics, hemorrhage necessitating transfusion and  the risks of transfusion, including transfusion reaction, hepatitis, HIV, mad cow disease and other unknown entities were all discussed understood and accepted. The risk of damage to internal organs during the procedure, either immediately recognized or delay recognized, including vagina, cervix, uterus, possible perforation causing damage to bowel, bladder, ureters, vessels and nerves necessitating major exploratory reparative surgery and future reparative surgeries including bladder repair, ureteral damage repair, bowel resection, ostomy formation was also discussed understood and accepted. The patient's questions were answered to her satisfaction and she is ready to proceed with surgery.  She will use Cytotec intravaginal as prescribed the night before surgery.   Anastasio Auerbach MD, 10:26 AM 05/28/2019

## 2019-05-28 NOTE — Addendum Note (Signed)
Addended by: Anastasio Auerbach on: 05/28/2019 10:32 AM   Modules accepted: Orders

## 2019-05-28 NOTE — Patient Instructions (Signed)
Followup for surgery as scheduled. 

## 2019-05-28 NOTE — H&P (Signed)
Lindsey Norton 09/17/1961 [id number]   History and Physical  Chief complaint: Postmenopausal bleeding, endometrial polyp  History of present illness: 58 y.o. G0P0000 with a history of no bleeding for several years and then had several bleeding episodes in January, February and subsequently the beginning of July.  Ultrasound showed uterus to be generous in size with small myoma measuring 14 mm.  Endometrial echo was thickened at 14.5 mm.  Right ovary normal.  Left ovary not visualized.  Sonohysterogram showed a large endometrial polyp measuring 65 x 15 x 55 mm filling the cavity.  Endometrial biopsy showed benign inactive endometrium.  Pap smear 11/2018 was normal  Past Medical History:  Diagnosis Date  . Hypertension   . Shingles 5/13    No past surgical history on file.  Family History  Problem Relation Age of Onset  . Diabetes Mother   . Hypertension Father     Social History:  reports that she has never smoked. She has never used smokeless tobacco. She reports current alcohol use. She reports that she does not use drugs.  Allergies: No Known Allergies  Medications: See Epic for the most current list of medications.  ROS:  Was performed and pertinent positives and negatives are included in the history of present illness.  Exam:   [name] assistant Vitals:   05/28/19 1008  BP: 124/80   General appearance:  Normal HEENT normal Lungs clear Cardiac regular rate no rubs murmurs or gallops Abdomen soft nontender without masses guarding rebound Pelvic external BUS vagina with atrophic changes.  Cervix with atrophic changes.  Uterus difficult to palpate but no gross masses or tenderness.    Assessment/Plan:  58 y.o. G0P0000 with postmenopausal bleeding and sonohysterogram showing a large endometrial polyp.  Endometrial biopsy showed benign inactive endometrium.  The patient is scheduled for hysteroscopy D&C with resection of the endometrial polyp.  I reviewed the proposed  surgery with the patient to include the expected intraoperative and postoperative courses as well as the recovery period. The use of the hysteroscope, resectoscope and the D&C portion were all discussed. The risks of surgery to include infection, prolonged antibiotics, hemorrhage necessitating transfusion and the risks of transfusion, including transfusion reaction, hepatitis, HIV, mad cow disease and other unknown entities were all discussed understood and accepted. The risk of damage to internal organs during the procedure, either immediately recognized or delay recognized, including vagina, cervix, uterus, possible perforation causing damage to bowel, bladder, ureters, vessels and nerves necessitating major exploratory reparative surgery and future reparative surgeries including bladder repair, ureteral damage repair, bowel resection, ostomy formation was also discussed understood and accepted. The patient's questions were answered to her satisfaction and she is ready to proceed with surgery.  She will use Cytotec intravaginal as prescribed the night before surgery.    Anastasio Auerbach MD, 10:31 AM 05/28/2019

## 2019-05-28 NOTE — Addendum Note (Signed)
Addended by: Joaquin Music on: 05/28/2019 10:32 AM   Modules accepted: Orders

## 2019-05-29 ENCOUNTER — Encounter (HOSPITAL_BASED_OUTPATIENT_CLINIC_OR_DEPARTMENT_OTHER): Payer: Self-pay | Admitting: *Deleted

## 2019-05-29 ENCOUNTER — Other Ambulatory Visit: Payer: Self-pay

## 2019-05-29 LAB — SARS CORONAVIRUS 2 (TAT 6-24 HRS): SARS Coronavirus 2: NEGATIVE

## 2019-05-29 NOTE — Progress Notes (Signed)
Spoke w/ pt via phone for pre-op interview.  Npo after mn.  Arrive at 0800.  Needs ekg.  Pt had cbc and cmp, as ordered by dr Phineas Real in pre-op orders,  done at dr fontaine's office yesterday.  Lab results will be in epic.  Will take norvasc am dos w/ sips of water.  Pt had covid test done yesterday.

## 2019-05-29 NOTE — Progress Notes (Signed)

## 2019-05-30 LAB — CBC WITH DIFFERENTIAL/PLATELET
Basophils Absolute: 0.1 10*3/uL (ref 0.0–0.2)
Basos: 1 %
EOS (ABSOLUTE): 0.2 10*3/uL (ref 0.0–0.4)
Eos: 2 %
Hematocrit: 41.3 % (ref 34.0–46.6)
Hemoglobin: 12.9 g/dL (ref 11.1–15.9)
Immature Grans (Abs): 0 10*3/uL (ref 0.0–0.1)
Immature Granulocytes: 0 %
Lymphocytes Absolute: 2.6 10*3/uL (ref 0.7–3.1)
Lymphs: 36 %
MCH: 25.6 pg — ABNORMAL LOW (ref 26.6–33.0)
MCHC: 31.2 g/dL — ABNORMAL LOW (ref 31.5–35.7)
MCV: 82 fL (ref 79–97)
Monocytes Absolute: 0.5 10*3/uL (ref 0.1–0.9)
Monocytes: 7 %
Neutrophils Absolute: 3.8 10*3/uL (ref 1.4–7.0)
Neutrophils: 54 %
Platelets: 295 10*3/uL (ref 150–450)
RBC: 5.03 x10E6/uL (ref 3.77–5.28)
RDW: 14.3 % (ref 11.7–15.4)
WBC: 7.2 10*3/uL (ref 3.4–10.8)

## 2019-05-30 LAB — COMPREHENSIVE METABOLIC PANEL
ALT: 15 IU/L (ref 0–32)
AST: 12 IU/L (ref 0–40)
Albumin/Globulin Ratio: 1.6 (ref 1.2–2.2)
Albumin: 4.2 g/dL (ref 3.8–4.9)
Alkaline Phosphatase: 100 IU/L (ref 39–117)
BUN/Creatinine Ratio: 18 (ref 9–23)
BUN: 17 mg/dL (ref 6–24)
Bilirubin Total: 0.3 mg/dL (ref 0.0–1.2)
CO2: 27 mmol/L (ref 20–29)
Calcium: 9.8 mg/dL (ref 8.7–10.2)
Chloride: 102 mmol/L (ref 96–106)
Creatinine, Ser: 0.93 mg/dL (ref 0.57–1.00)
GFR calc Af Amer: 79 mL/min/{1.73_m2} (ref >59)
GFR calc non Af Amer: 69 mL/min/{1.73_m2} (ref >59)
Globulin, Total: 2.7 g/dL (ref 1.5–4.5)
Glucose: 102 mg/dL — ABNORMAL HIGH (ref 65–99)
Potassium: 4.3 mmol/L (ref 3.5–5.2)
Sodium: 142 mmol/L (ref 134–144)
Total Protein: 6.9 g/dL (ref 6.0–8.5)

## 2019-06-01 ENCOUNTER — Encounter (HOSPITAL_BASED_OUTPATIENT_CLINIC_OR_DEPARTMENT_OTHER): Payer: Self-pay | Admitting: *Deleted

## 2019-06-01 ENCOUNTER — Ambulatory Visit (HOSPITAL_BASED_OUTPATIENT_CLINIC_OR_DEPARTMENT_OTHER)
Admission: RE | Admit: 2019-06-01 | Discharge: 2019-06-01 | Disposition: A | Discharge status: Home / Self Care | Payer: BC Managed Care – PPO | Source: Ambulatory Visit | Attending: Gynecology | Admitting: Gynecology

## 2019-06-01 ENCOUNTER — Other Ambulatory Visit: Payer: Self-pay

## 2019-06-01 ENCOUNTER — Encounter: Payer: Self-pay | Admitting: Gynecology

## 2019-06-01 ENCOUNTER — Ambulatory Visit (HOSPITAL_BASED_OUTPATIENT_CLINIC_OR_DEPARTMENT_OTHER): Payer: BC Managed Care – PPO | Admitting: Anesthesiology

## 2019-06-01 ENCOUNTER — Encounter (HOSPITAL_BASED_OUTPATIENT_CLINIC_OR_DEPARTMENT_OTHER)
Admission: RE | Disposition: A | Discharge status: Home / Self Care | Payer: Self-pay | Source: Ambulatory Visit | Attending: Gynecology

## 2019-06-01 ENCOUNTER — Other Ambulatory Visit (HOSPITAL_COMMUNITY): Payer: 59

## 2019-06-01 DIAGNOSIS — D250 Submucous leiomyoma of uterus: Secondary | ICD-10-CM | POA: Diagnosis not present

## 2019-06-01 DIAGNOSIS — N840 Polyp of corpus uteri: Secondary | ICD-10-CM | POA: Diagnosis not present

## 2019-06-01 DIAGNOSIS — Z8249 Family history of ischemic heart disease and other diseases of the circulatory system: Secondary | ICD-10-CM | POA: Insufficient documentation

## 2019-06-01 DIAGNOSIS — I10 Essential (primary) hypertension: Secondary | ICD-10-CM | POA: Insufficient documentation

## 2019-06-01 DIAGNOSIS — Z79899 Other long term (current) drug therapy: Secondary | ICD-10-CM | POA: Insufficient documentation

## 2019-06-01 DIAGNOSIS — N950 Postmenopausal bleeding: Secondary | ICD-10-CM | POA: Diagnosis not present

## 2019-06-01 DIAGNOSIS — R7303 Prediabetes: Secondary | ICD-10-CM | POA: Diagnosis not present

## 2019-06-01 DIAGNOSIS — Z791 Long term (current) use of non-steroidal anti-inflammatories (NSAID): Secondary | ICD-10-CM | POA: Insufficient documentation

## 2019-06-01 HISTORY — DX: Presence of spectacles and contact lenses: Z97.3

## 2019-06-01 HISTORY — DX: Postmenopausal bleeding: N95.0

## 2019-06-01 HISTORY — DX: Leiomyoma of uterus, unspecified: D25.9

## 2019-06-01 HISTORY — DX: Prediabetes: R73.03

## 2019-06-01 HISTORY — DX: Polyp of corpus uteri: N84.0

## 2019-06-01 HISTORY — DX: Unspecified osteoarthritis, unspecified site: M19.90

## 2019-06-01 HISTORY — PX: DILATATION & CURETTAGE/HYSTEROSCOPY WITH MYOSURE: SHX6511

## 2019-06-01 LAB — GLUCOSE, CAPILLARY: Glucose-Capillary: 91 mg/dL (ref 70–99)

## 2019-06-01 SURGERY — DILATATION & CURETTAGE/HYSTEROSCOPY WITH MYOSURE
Anesthesia: General | Site: Vagina

## 2019-06-01 MED ORDER — PROPOFOL 10 MG/ML IV BOLUS
INTRAVENOUS | Status: DC | PRN
  Administered 2019-06-01: 160 mg via INTRAVENOUS

## 2019-06-01 MED ORDER — FENTANYL CITRATE (PF) 100 MCG/2ML IJ SOLN
INTRAMUSCULAR | Status: DC | PRN
  Administered 2019-06-01 (×2): 25 ug via INTRAVENOUS
  Administered 2019-06-01: 50 ug via INTRAVENOUS

## 2019-06-01 MED ORDER — ONDANSETRON HCL 4 MG/2ML IJ SOLN
INTRAMUSCULAR | Status: DC | PRN
  Administered 2019-06-01: 4 mg via INTRAVENOUS

## 2019-06-01 MED ORDER — DEXAMETHASONE SODIUM PHOSPHATE 4 MG/ML IJ SOLN
INTRAMUSCULAR | Status: DC | PRN
  Administered 2019-06-01: 8 mg via INTRAVENOUS

## 2019-06-01 MED ORDER — KETOROLAC TROMETHAMINE 30 MG/ML IJ SOLN
INTRAMUSCULAR | Status: AC
  Filled 2019-06-01: qty 1

## 2019-06-01 MED ORDER — MIDAZOLAM HCL 2 MG/2ML IJ SOLN
INTRAMUSCULAR | Status: AC
  Filled 2019-06-01: qty 2

## 2019-06-01 MED ORDER — MEPERIDINE HCL 25 MG/ML IJ SOLN
6.2500 mg | INTRAMUSCULAR | Status: DC | PRN
  Filled 2019-06-01: qty 1

## 2019-06-01 MED ORDER — KETOROLAC TROMETHAMINE 30 MG/ML IJ SOLN
INTRAMUSCULAR | Status: DC | PRN
  Administered 2019-06-01: 30 mg via INTRAVENOUS

## 2019-06-01 MED ORDER — LACTATED RINGERS IV SOLN
INTRAVENOUS | Status: DC
  Administered 2019-06-01: 08:00:00 via INTRAVENOUS
  Filled 2019-06-01: qty 1000

## 2019-06-01 MED ORDER — MIDAZOLAM HCL 5 MG/5ML IJ SOLN
INTRAMUSCULAR | Status: DC | PRN
  Administered 2019-06-01: 2 mg via INTRAVENOUS

## 2019-06-01 MED ORDER — FENTANYL CITRATE (PF) 100 MCG/2ML IJ SOLN
INTRAMUSCULAR | Status: AC
  Filled 2019-06-01: qty 2

## 2019-06-01 MED ORDER — PROMETHAZINE HCL 25 MG/ML IJ SOLN
6.2500 mg | INTRAMUSCULAR | Status: DC | PRN
  Filled 2019-06-01: qty 1

## 2019-06-01 MED ORDER — LIDOCAINE HCL 1 % IJ SOLN
INTRAMUSCULAR | Status: DC | PRN
  Administered 2019-06-01: 10 mL

## 2019-06-01 MED ORDER — SODIUM CHLORIDE 0.9 % IV SOLN
2.0000 g | INTRAVENOUS | Status: AC
  Administered 2019-06-01: 10:00:00 2 g via INTRAVENOUS
  Filled 2019-06-01: qty 2

## 2019-06-01 MED ORDER — OXYCODONE HCL 5 MG/5ML PO SOLN
5.0000 mg | Freq: Once | ORAL | Status: DC | PRN
  Filled 2019-06-01: qty 5

## 2019-06-01 MED ORDER — ONDANSETRON HCL 4 MG/2ML IJ SOLN
INTRAMUSCULAR | Status: AC
  Filled 2019-06-01: qty 2

## 2019-06-01 MED ORDER — LIDOCAINE 2% (20 MG/ML) 5 ML SYRINGE
INTRAMUSCULAR | Status: AC
  Filled 2019-06-01: qty 5

## 2019-06-01 MED ORDER — DEXAMETHASONE SODIUM PHOSPHATE 10 MG/ML IJ SOLN
INTRAMUSCULAR | Status: AC
  Filled 2019-06-01: qty 1

## 2019-06-01 MED ORDER — HYDROMORPHONE HCL 1 MG/ML IJ SOLN
0.2500 mg | INTRAMUSCULAR | Status: DC | PRN
  Filled 2019-06-01: qty 0.5

## 2019-06-01 MED ORDER — PROPOFOL 10 MG/ML IV BOLUS
INTRAVENOUS | Status: AC
  Filled 2019-06-01: qty 20

## 2019-06-01 MED ORDER — LIDOCAINE 2% (20 MG/ML) 5 ML SYRINGE
INTRAMUSCULAR | Status: DC | PRN
  Administered 2019-06-01: 100 mg via INTRAVENOUS

## 2019-06-01 MED ORDER — OXYCODONE HCL 5 MG PO TABS
5.0000 mg | ORAL_TABLET | Freq: Once | ORAL | Status: DC | PRN
  Filled 2019-06-01: qty 1

## 2019-06-01 MED ORDER — SODIUM CHLORIDE 0.9 % IV SOLN
INTRAVENOUS | Status: AC
  Filled 2019-06-01: qty 2

## 2019-06-01 SURGICAL SUPPLY — 20 items
CANISTER SUCT 3000ML PPV (MISCELLANEOUS) ×3 IMPLANT
CATH ROBINSON RED A/P 16FR (CATHETERS) ×3 IMPLANT
COVER WAND RF STERILE (DRAPES) ×3 IMPLANT
DEVICE MYOSURE LITE (MISCELLANEOUS) IMPLANT
DEVICE MYOSURE REACH (MISCELLANEOUS) ×1 IMPLANT
DILATOR CANAL MILEX (MISCELLANEOUS) IMPLANT
GAUZE 4X4 16PLY RFD (DISPOSABLE) ×3 IMPLANT
GLOVE BIO SURGEON STRL SZ7.5 (GLOVE) ×6 IMPLANT
GOWN STRL REUS W/TWL XL LVL3 (GOWN DISPOSABLE) ×3 IMPLANT
IV NS IRRIG 3000ML ARTHROMATIC (IV SOLUTION) ×3 IMPLANT
KIT PROCEDURE FLUENT (KITS) ×3 IMPLANT
KIT TURNOVER CYSTO (KITS) ×3 IMPLANT
MYOSURE XL FIBROID (MISCELLANEOUS)
PACK VAGINAL MINOR WOMEN LF (CUSTOM PROCEDURE TRAY) ×3 IMPLANT
PAD OB MATERNITY 4.3X12.25 (PERSONAL CARE ITEMS) ×3 IMPLANT
SEAL CERVICAL OMNI LOK (ABLATOR) ×1 IMPLANT
SEAL ROD LENS SCOPE MYOSURE (ABLATOR) ×3 IMPLANT
SYSTEM TISS REMOVAL MYOSURE XL (MISCELLANEOUS) IMPLANT
TOWEL OR 17X26 10 PK STRL BLUE (TOWEL DISPOSABLE) ×6 IMPLANT
WATER STERILE IRR 500ML POUR (IV SOLUTION) IMPLANT

## 2019-06-01 NOTE — H&P (Signed)
Late entry: The patient was examined preoperatively.  I reviewed the proposed surgery and consent form with the patient preoperatively.  The dictated history and physical is current and accurate and all questions were answered. The patient is ready to proceed with surgery and has a realistic understanding and expectation for the outcome.   Anastasio Auerbach MD, 10:55 AM 06/01/2019

## 2019-06-01 NOTE — Discharge Instructions (Signed)
° °  Postoperative Instructions Hysteroscopy D & C  Dr. Phineas Real and the nursing staff have discussed postoperative instructions with you.  If you have any questions please ask them before you leave the hospital, or call Dr Elisabeth Most office at [phone].    We would like to emphasize the following instructions:   ? Call the office to make your follow-up appointment as recommended by Dr Phineas Real (usually 1-2 weeks).  ? You were given a prescription, or one was ordered for you at the pharmacy you designated.  Get that prescription filled and take the medication according to instructions.  ? You may eat a regular diet, but slowly until you start having bowel movements.  ? Drink plenty of water daily.  ? Nothing in the vagina (intercourse, douching, objects of any kind) for two weeks.  When reinitiating intercourse, if it is uncomfortable, stop and make an appointment with Dr Phineas Real to be evaluated.  ? No driving for one to two days until the effects of anesthesia has worn off.  No traveling out of town for several days.  ? You may shower, but no baths for one week.  Walking up and down stairs is ok.  No heavy lifting, prolonged standing, repeated bending or any working out until your post op check.  ? Rest frequently, listen to your body and do not push yourself and overdo it.  ? Call if:  o Your pain medication does not seem strong enough. o Worsening pain or abdominal bloating o Persistent nausea or vomiting o Difficulty with urination or bowel movements. o Temperature of 101 degrees or higher. o Heavy vaginal bleeding.  If your period is due, you may use tampons. o You have any questions or concerns  Next dose ibuprofen,Motrin after 430 pm  Post Anesthesia Home Care Instructions  Activity: Get plenty of rest for the remainder of the day. A responsible individual must stay with you for 24 hours following the procedure.  For the next 24 hours, DO NOT: -Drive a  car -Paediatric nurse -Drink alcoholic beverages -Take any medication unless instructed by your physician -Make any legal decisions or sign important papers.  Meals: Start with liquid foods such as gelatin or soup. Progress to regular foods as tolerated. Avoid greasy, spicy, heavy foods. If nausea and/or vomiting occur, drink only clear liquids until the nausea and/or vomiting subsides. Call your physician if vomiting continues.  Special Instructions/Symptoms: Your throat may feel dry or sore from the anesthesia or the breathing tube placed in your throat during surgery. If this causes discomfort, gargle with warm salt water. The discomfort should disappear within 24 hours.  If you had a scopolamine patch placed behind your ear for the management of post- operative nausea and/or vomiting:  1. The medication in the patch is effective for 72 hours, after which it should be removed.  Wrap patch in a tissue and discard in the trash. Wash hands thoroughly with soap and water. 2. You may remove the patch earlier than 72 hours if you experience unpleasant side effects which may include dry mouth, dizziness or visual disturbances. 3. Avoid touching the patch. Wash your hands with soap and water after contact with the patch.

## 2019-06-01 NOTE — Op Note (Signed)
Lindsey Norton 10/02/1961 [id number]   Post Operative Note   Date of surgery:  06/01/2019  Pre Op Dx: Postmenopausal bleeding, endometrial polyp, leiomyoma  Post Op Dx: Postmenopausal bleeding, endometrial polyp, leiomyoma  Procedure: Hysteroscopy, D&C, MyoSure resection endometrial polyp and submucous myoma  Surgeon:  Belinda Block Shanekqua Schaper  Anesthesia:  General  EBL: 5 cc  Distended media discrepancy: 90 cc saline  Complications:  None  Specimen: #1 endometrial polyp and submucous myoma fragments #2 endometrial curetting to pathology  Findings: EUA: External BUS vagina with atrophic changes.  Cervix with atrophic changes.  Uterus generous in size midline mobile.  Adnexa without masses   Hysteroscopy: Adequate noting fundus, right/left tubal ostia, anterior/posterior endometrial surfaces, lower uterine segment, endocervical canal all visualized.  Large endometrial polyp filling the endometrial cavity extending into the upper cervical canal.  Resected in its entirety to the level of surrounding endometrium along the upper posterior endometrial surface.  Small submucous myoma mid posterior endometrial surface resected to the level the surrounding endometrium.  Procedure:  The patient was taken to the operating room, was placed in the low dorsal lithotomy position, underwent general anesthesia, received a perineal/vaginal preparation per nursing personnel.  The patient voided on her way to the operating room and a Foley catheterization was not performed.  The timeout was performed by the surgical team. An EUA was performed. The patient was draped in the usual fashion. The cervix was visualized with a speculum, anterior lip grasped with a single-tooth tenaculum and a paracervical block was placed using 10 cc's of 1% lidocaine. The cervix was gently dilated to admit the Myosure hysteroscope and hysteroscopy was performed with findings noted above. Using the Myosure Reach resectoscopic wand the  polyp was resected in it's entirety to the level the surrounding endometrium.  The submucous myoma was then resected to the level of the surrounding endometrium without difficulty.  A gentle sharp curettage was performed. Both specimens were sent separately to pathology.  Repeat hysteroscopy showed an empty cavity with good distention and no evidence of perforation. The instruments were removed and adequate hemostasis was visualized at the tenaculum site and external cervical os.  The specimens were identified for pathology.  The sponge, needle and instrument count were verified correct.  The patient was wanded per protocol.  The patient was given intraoperative Toradol, was awakened without difficulty and was taken to the recovery room in good condition having tolerated the procedure well.   Anastasio Auerbach MD, 10:39 AM 06/01/2019

## 2019-06-01 NOTE — Anesthesia Procedure Notes (Signed)
Procedure Name: LMA Insertion Date/Time: 06/01/2019 10:08 AM Performed by: Lynda Rainwater, MD Pre-anesthesia Checklist: Patient identified, Emergency Drugs available, Suction available and Patient being monitored Patient Re-evaluated:Patient Re-evaluated prior to induction Oxygen Delivery Method: Circle system utilized Preoxygenation: Pre-oxygenation with 100% oxygen Induction Type: IV induction Ventilation: Mask ventilation without difficulty LMA: LMA inserted LMA Size: 4.0 Number of attempts: 1 Airway Equipment and Method: Bite block Placement Confirmation: positive ETCO2 Tube secured with: Tape Dental Injury: Teeth and Oropharynx as per pre-operative assessment

## 2019-06-01 NOTE — Anesthesia Postprocedure Evaluation (Signed)
Anesthesia Post Note  Patient: Lindsey Norton  Procedure(s) Performed: DILATATION & CURETTAGE/HYSTEROSCOPY WITH MYOSURE (N/A Vagina )     Patient location during evaluation: PACU Anesthesia Type: General Level of consciousness: awake and alert Pain management: pain level controlled Vital Signs Assessment: post-procedure vital signs reviewed and stable Respiratory status: spontaneous breathing, nonlabored ventilation and respiratory function stable Cardiovascular status: blood pressure returned to baseline and stable Postop Assessment: no apparent nausea or vomiting Anesthetic complications: no    Last Vitals:  Vitals:   06/01/19 1130 06/01/19 1204  BP: (!) 167/76 (!) 142/75  Pulse: (!) 56 (!) 53  Resp: (!) 8 12  Temp:  36.5 C  SpO2: 100% 100%    Last Pain:  Vitals:   06/01/19 1204  TempSrc:   PainSc: 0-No pain                 Lynda Rainwater

## 2019-06-01 NOTE — Anesthesia Preprocedure Evaluation (Signed)
Anesthesia Evaluation  Patient identified by MRN, date of birth, ID band Patient awake    Reviewed: Allergy & Precautions, NPO status , Patient's Chart, lab work & pertinent test results  Airway Mallampati: II  TM Distance: >3 FB Neck ROM: Full    Dental no notable dental hx.    Pulmonary neg pulmonary ROS,    Pulmonary exam normal breath sounds clear to auscultation       Cardiovascular hypertension, Pt. on medications negative cardio ROS Normal cardiovascular exam Rhythm:Regular Rate:Normal     Neuro/Psych negative neurological ROS  negative psych ROS   GI/Hepatic negative GI ROS, Neg liver ROS,   Endo/Other  Morbid obesity  Renal/GU negative Renal ROS  negative genitourinary   Musculoskeletal  (+) Arthritis , Osteoarthritis,    Abdominal (+) + obese,   Peds negative pediatric ROS (+)  Hematology negative hematology ROS (+)   Anesthesia Other Findings   Reproductive/Obstetrics negative OB ROS                             Anesthesia Physical Anesthesia Plan  ASA: III  Anesthesia Plan: General   Post-op Pain Management:    Induction: Intravenous  PONV Risk Score and Plan: 3 and Ondansetron, Dexamethasone, Midazolam and Treatment may vary due to age or medical condition  Airway Management Planned: LMA  Additional Equipment:   Intra-op Plan:   Post-operative Plan: Extubation in OR  Informed Consent: I have reviewed the patients History and Physical, chart, labs and discussed the procedure including the risks, benefits and alternatives for the proposed anesthesia with the patient or authorized representative who has indicated his/her understanding and acceptance.     Dental advisory given  Plan Discussed with: CRNA  Anesthesia Plan Comments:         Anesthesia Quick Evaluation

## 2019-06-01 NOTE — Transfer of Care (Signed)
Last Vitals:  Vitals Value Taken Time  BP 158/77 06/01/19 1046  Temp    Pulse 67 06/01/19 1048  Resp 0 06/01/19 1048  SpO2 100 % 06/01/19 1048  Vitals shown include unvalidated device data.  Last Pain:  Vitals:   06/01/19 0820  TempSrc:   PainSc: 0-No pain      Patients Stated Pain Goal: 4 (06/01/19 0820)  Immediate Anesthesia Transfer of Care Note  Patient: Lindsey Norton  Procedure(s) Performed: Procedure(s) (LRB): DILATATION & CURETTAGE/HYSTEROSCOPY WITH MYOSURE (N/A)  Patient Location: PACU  Anesthesia Type: General  Level of Consciousness: awake, alert  and oriented  Airway & Oxygen Therapy: Patient Spontanous Breathing and Patient connected to nasal cannula oxygen  Post-op Assessment: Report given to PACU RN and Post -op Vital signs reviewed and stable  Post vital signs: Reviewed and stable  Complications: No apparent anesthesia complications

## 2019-06-02 ENCOUNTER — Encounter: Payer: Self-pay | Admitting: Gynecology

## 2019-06-02 ENCOUNTER — Encounter (HOSPITAL_BASED_OUTPATIENT_CLINIC_OR_DEPARTMENT_OTHER): Payer: Self-pay | Admitting: Gynecology

## 2019-06-04 ENCOUNTER — Telehealth: Payer: Self-pay

## 2019-06-04 NOTE — Telephone Encounter (Signed)
Patient said Dr. Loetta Lindsey Norton told her that she did not need to return in 2 weeks for post op visit "due to Covid". She is inquiring does she need to return at all for follow up, even later on?

## 2019-06-04 NOTE — Telephone Encounter (Signed)
Assuming she has no further bleeding after the bleeding from the surgery resolves then she can follow-up with Lindsey Norton when she is due for her annual exam beginning of next year.

## 2019-06-04 NOTE — Telephone Encounter (Signed)
Called patient and per DPR access note on file I left detailed message in voice mail.

## 2019-06-04 NOTE — Telephone Encounter (Signed)
Patient called to inquire what time frame bleeding after surgery is okay because she is bleeding light today. I reassured her that bleeding today is related to surgery and might go on and off for a week to 10 days. Once bleeding from surgery stops if she has anymore bleeding before CE 1st of year she should call and report.

## 2019-07-08 ENCOUNTER — Encounter: Payer: Self-pay | Admitting: Gynecology

## 2019-07-16 DIAGNOSIS — N6001 Solitary cyst of right breast: Secondary | ICD-10-CM | POA: Diagnosis not present

## 2019-08-18 ENCOUNTER — Encounter: Payer: Self-pay | Admitting: Gynecology

## 2019-09-03 DIAGNOSIS — M25561 Pain in right knee: Secondary | ICD-10-CM | POA: Diagnosis not present

## 2019-09-08 DIAGNOSIS — S86911D Strain of unspecified muscle(s) and tendon(s) at lower leg level, right leg, subsequent encounter: Secondary | ICD-10-CM | POA: Diagnosis not present

## 2019-09-25 DIAGNOSIS — R531 Weakness: Secondary | ICD-10-CM | POA: Diagnosis not present

## 2019-09-25 DIAGNOSIS — R269 Unspecified abnormalities of gait and mobility: Secondary | ICD-10-CM | POA: Diagnosis not present

## 2019-09-25 DIAGNOSIS — M25561 Pain in right knee: Secondary | ICD-10-CM | POA: Diagnosis not present

## 2019-10-02 DIAGNOSIS — M25561 Pain in right knee: Secondary | ICD-10-CM | POA: Diagnosis not present

## 2019-10-02 DIAGNOSIS — R531 Weakness: Secondary | ICD-10-CM | POA: Diagnosis not present

## 2019-10-02 DIAGNOSIS — R269 Unspecified abnormalities of gait and mobility: Secondary | ICD-10-CM | POA: Diagnosis not present

## 2019-10-09 DIAGNOSIS — R269 Unspecified abnormalities of gait and mobility: Secondary | ICD-10-CM | POA: Diagnosis not present

## 2019-10-09 DIAGNOSIS — R531 Weakness: Secondary | ICD-10-CM | POA: Diagnosis not present

## 2019-10-09 DIAGNOSIS — M25561 Pain in right knee: Secondary | ICD-10-CM | POA: Diagnosis not present

## 2019-10-21 DIAGNOSIS — R269 Unspecified abnormalities of gait and mobility: Secondary | ICD-10-CM | POA: Diagnosis not present

## 2019-10-21 DIAGNOSIS — M25561 Pain in right knee: Secondary | ICD-10-CM | POA: Diagnosis not present

## 2019-10-21 DIAGNOSIS — R531 Weakness: Secondary | ICD-10-CM | POA: Diagnosis not present

## 2019-12-24 DIAGNOSIS — M1711 Unilateral primary osteoarthritis, right knee: Secondary | ICD-10-CM | POA: Diagnosis not present

## 2020-05-09 DIAGNOSIS — R7303 Prediabetes: Secondary | ICD-10-CM | POA: Diagnosis not present

## 2020-05-09 DIAGNOSIS — E669 Obesity, unspecified: Secondary | ICD-10-CM | POA: Diagnosis not present

## 2020-05-09 DIAGNOSIS — E7800 Pure hypercholesterolemia, unspecified: Secondary | ICD-10-CM | POA: Diagnosis not present

## 2020-05-09 DIAGNOSIS — I10 Essential (primary) hypertension: Secondary | ICD-10-CM | POA: Diagnosis not present

## 2020-05-12 ENCOUNTER — Encounter: Payer: BC Managed Care – PPO | Admitting: Nurse Practitioner

## 2020-05-12 DIAGNOSIS — N6011 Diffuse cystic mastopathy of right breast: Secondary | ICD-10-CM | POA: Diagnosis not present

## 2020-05-12 DIAGNOSIS — R928 Other abnormal and inconclusive findings on diagnostic imaging of breast: Secondary | ICD-10-CM | POA: Diagnosis not present

## 2020-05-26 ENCOUNTER — Encounter: Payer: Self-pay | Admitting: Nurse Practitioner

## 2020-05-26 ENCOUNTER — Ambulatory Visit (INDEPENDENT_AMBULATORY_CARE_PROVIDER_SITE_OTHER): Payer: BC Managed Care – PPO | Admitting: Nurse Practitioner

## 2020-05-26 ENCOUNTER — Other Ambulatory Visit: Payer: Self-pay

## 2020-05-26 VITALS — BP 125/80 | Ht 67.0 in | Wt 289.4 lb

## 2020-05-26 DIAGNOSIS — E559 Vitamin D deficiency, unspecified: Secondary | ICD-10-CM | POA: Diagnosis not present

## 2020-05-26 DIAGNOSIS — Z01419 Encounter for gynecological examination (general) (routine) without abnormal findings: Secondary | ICD-10-CM

## 2020-05-26 NOTE — Patient Instructions (Addendum)
Health Maintenance, Female Adopting a healthy lifestyle and getting preventive care are important in promoting health and wellness. Ask your health care provider about:  The right schedule for you to have regular tests and exams.  Things you can do on your own to prevent diseases and keep yourself healthy. What should I know about diet, weight, and exercise? Eat a healthy diet   Eat a diet that includes plenty of vegetables, fruits, low-fat dairy products, and lean protein.  Do not eat a lot of foods that are high in solid fats, added sugars, or sodium. Maintain a healthy weight Body mass index (BMI) is used to identify weight problems. It estimates body fat based on height and weight. Your health care provider can help determine your BMI and help you achieve or maintain a healthy weight. Get regular exercise Get regular exercise. This is one of the most important things you can do for your health. Most adults should:  Exercise for at least 150 minutes each week. The exercise should increase your heart rate and make you sweat (moderate-intensity exercise).  Do strengthening exercises at least twice a week. This is in addition to the moderate-intensity exercise.  Spend less time sitting. Even light physical activity can be beneficial. Watch cholesterol and blood lipids Have your blood tested for lipids and cholesterol at 58 years of age, then have this test every 5 years. Have your cholesterol levels checked more often if:  Your lipid or cholesterol levels are high.  You are older than 58 years of age.  You are at high risk for heart disease. What should I know about cancer screening? Depending on your health history and family history, you may need to have cancer screening at various ages. This may include screening for:  Breast cancer.  Cervical cancer.  Colorectal cancer.  Skin cancer.  Lung cancer. What should I know about heart disease, diabetes, and high blood  pressure? Blood pressure and heart disease  High blood pressure causes heart disease and increases the risk of stroke. This is more likely to develop in people who have high blood pressure readings, are of African descent, or are overweight.  Have your blood pressure checked: ? Every 3-5 years if you are 18-39 years of age. ? Every year if you are 40 years old or older. Diabetes Have regular diabetes screenings. This checks your fasting blood sugar level. Have the screening done:  Once every three years after age 40 if you are at a normal weight and have a low risk for diabetes.  More often and at a younger age if you are overweight or have a high risk for diabetes. What should I know about preventing infection? Hepatitis B If you have a higher risk for hepatitis B, you should be screened for this virus. Talk with your health care provider to find out if you are at risk for hepatitis B infection. Hepatitis C Testing is recommended for:  Everyone born from 1945 through 1965.  Anyone with known risk factors for hepatitis C. Sexually transmitted infections (STIs)  Get screened for STIs, including gonorrhea and chlamydia, if: ? You are sexually active and are younger than 58 years of age. ? You are older than 58 years of age and your health care provider tells you that you are at risk for this type of infection. ? Your sexual activity has changed since you were last screened, and you are at increased risk for chlamydia or gonorrhea. Ask your health care provider if   you are at risk.  Ask your health care provider about whether you are at high risk for HIV. Your health care provider may recommend a prescription medicine to help prevent HIV infection. If you choose to take medicine to prevent HIV, you should first get tested for HIV. You should then be tested every 3 months for as long as you are taking the medicine. Pregnancy  If you are about to stop having your period (premenopausal) and  you may become pregnant, seek counseling before you get pregnant.  Take 400 to 800 micrograms (mcg) of folic acid every day if you become pregnant.  Ask for birth control (contraception) if you want to prevent pregnancy. Osteoporosis and menopause Osteoporosis is a disease in which the bones lose minerals and strength with aging. This can result in bone fractures. If you are 65 years old or older, or if you are at risk for osteoporosis and fractures, ask your health care provider if you should:  Be screened for bone loss.  Take a calcium or vitamin D supplement to lower your risk of fractures.  Be given hormone replacement therapy (HRT) to treat symptoms of menopause. Follow these instructions at home: Lifestyle  Do not use any products that contain nicotine or tobacco, such as cigarettes, e-cigarettes, and chewing tobacco. If you need help quitting, ask your health care provider.  Do not use street drugs.  Do not share needles.  Ask your health care provider for help if you need support or information about quitting drugs. Alcohol use  Do not drink alcohol if: ? Your health care provider tells you not to drink. ? You are pregnant, may be pregnant, or are planning to become pregnant.  If you drink alcohol: ? Limit how much you use to 0-1 drink a day. ? Limit intake if you are breastfeeding.  Be aware of how much alcohol is in your drink. In the [place] one drink equals one 12 oz bottle of beer (355 mL), one 5 oz glass of wine (148 mL), or one 1 oz glass of hard liquor (44 mL). General instructions  Schedule regular health, dental, and eye exams.  Stay current with your vaccines.  Tell your health care provider if: ? You often feel depressed. ? You have ever been abused or do not feel safe at home. Summary  Adopting a healthy lifestyle and getting preventive care are important in promoting health and wellness.  Follow your health care provider's instructions about healthy  diet, exercising, and getting tested or screened for diseases.  Follow your health care provider's instructions on monitoring your cholesterol and blood pressure. This information is not intended to replace advice given to you by your health care provider. Make sure you discuss any questions you have with your health care provider. Document Revised: 10/29/2018 Document Reviewed: 10/29/2018 Elsevier Patient Education  2020 Elsevier Inc.  

## 2020-05-26 NOTE — Progress Notes (Signed)
   Lindsey Norton 02-Nov-1961 [id number]   History:  59 y.o. G0 presents for annual exam without GYN complaints. Postmenopausal, no HRT, no bleeding. Denies menopausal symptoms. Polyp removal 06/01/2019. Normal pap and mammogram history. Following nodule on right breast, it is shrinking and confirmed benign by biopsy per patient. Primary care manages HTN and prediabetes. Plans to begin glucose checks at home. Not currently sexually active.   Gynecologic History Patient's last menstrual period was 01/03/2019.   Contraception: post menopausal status Last Pap: 01/14/2019. Results were: normal Last mammogram: 04/2020. Results were:normal per patient. We do not have records. She will get them sent here from [place] colonoscopy: 2009  Past medical history, past surgical history, family history and social history were all reviewed and documented in the EPIC chart.  ROS:  A ROS was performed and pertinent positives and negatives are included.  Exam:  Vitals:   05/26/20 1602  BP: 125/80  Weight: 289 lb 6.4 oz (131.3 kg)  Height: 5\' 7"  (1.702 m)   Body mass index is 45.33 kg/m.  General appearance:  Normal Thyroid:  Symmetrical, normal in size, without palpable masses or nodularity. Respiratory  Auscultation:  Clear without wheezing or rhonchi Cardiovascular  Auscultation:  Regular rate, without rubs, murmurs or gallops  Edema/varicosities:  Not grossly evident Abdominal  Soft,nontender, without masses, guarding or rebound.  Liver/spleen:  No organomegaly noted  Hernia:  None appreciated  Skin  Inspection:  Grossly normal   Breasts: Examined lying and sitting.   Right: Without masses, retractions, discharge or axillary adenopathy.   Left: Without masses, retractions, discharge or axillary adenopathy. Gentitourinary   Inguinal/mons:  Normal without inguinal adenopathy  External genitalia:  Normal  BUS/Urethra/Skene's glands:  Normal  Vagina:  Normal  Cervix:  Normal  Uterus:   Difficult to palpate but no gross masses or tenderness  Adnexa/parametria:     Rt: Without masses or tenderness.   Lt: Without masses or tenderness.  Anus and perineum: Normal  Digital rectal exam: Normal sphincter tone without palpated masses or tenderness  Assessment/Plan:  59 y.o. G0 for annual exam.   Well woman exam with routine gynecological exam - Plan: CBC with Differential/Platelet, Comprehensive metabolic panel. Education provided on SBEs, importance of preventative screenings, current guidelines, high calcium diet, regular exercise, and multivitamin daily.   Vitamin D deficiency - Plan: VITAMIN D 25 Hydroxy (Vit-D Deficiency, Fractures). Taking 400 units Vitamin D daily  Screening for colon cancer - overdue for colonoscopy. Discussed guidelines and importance of preventative screenings. Recommended scheduling soon.  Follow up in 1 year for annual      [place], 4:17 PM 05/26/2020

## 2020-06-24 DIAGNOSIS — Z0000 Encounter for general adult medical examination without abnormal findings: Secondary | ICD-10-CM | POA: Diagnosis not present

## 2020-08-19 DIAGNOSIS — E7800 Pure hypercholesterolemia, unspecified: Secondary | ICD-10-CM | POA: Diagnosis not present

## 2020-08-19 DIAGNOSIS — E559 Vitamin D deficiency, unspecified: Secondary | ICD-10-CM | POA: Diagnosis not present

## 2020-08-19 DIAGNOSIS — R7303 Prediabetes: Secondary | ICD-10-CM | POA: Diagnosis not present

## 2020-09-02 DIAGNOSIS — E669 Obesity, unspecified: Secondary | ICD-10-CM | POA: Diagnosis not present

## 2020-09-02 DIAGNOSIS — I10 Essential (primary) hypertension: Secondary | ICD-10-CM | POA: Diagnosis not present

## 2020-09-02 DIAGNOSIS — E7800 Pure hypercholesterolemia, unspecified: Secondary | ICD-10-CM | POA: Diagnosis not present

## 2020-09-02 DIAGNOSIS — R7303 Prediabetes: Secondary | ICD-10-CM | POA: Diagnosis not present

## 2021-06-30 DIAGNOSIS — E1169 Type 2 diabetes mellitus with other specified complication: Secondary | ICD-10-CM | POA: Diagnosis not present

## 2021-06-30 DIAGNOSIS — Z0000 Encounter for general adult medical examination without abnormal findings: Secondary | ICD-10-CM | POA: Diagnosis not present

## 2021-06-30 DIAGNOSIS — I10 Essential (primary) hypertension: Secondary | ICD-10-CM | POA: Diagnosis not present

## 2021-06-30 DIAGNOSIS — E7800 Pure hypercholesterolemia, unspecified: Secondary | ICD-10-CM | POA: Diagnosis not present

## 2021-06-30 DIAGNOSIS — E559 Vitamin D deficiency, unspecified: Secondary | ICD-10-CM | POA: Diagnosis not present

## 2021-07-26 ENCOUNTER — Encounter: Payer: Self-pay | Admitting: Nurse Practitioner

## 2021-07-26 ENCOUNTER — Other Ambulatory Visit: Payer: Self-pay

## 2021-07-26 ENCOUNTER — Ambulatory Visit: Payer: BC Managed Care – PPO | Admitting: Nurse Practitioner

## 2021-07-26 VITALS — BP 160/86

## 2021-07-26 DIAGNOSIS — N950 Postmenopausal bleeding: Secondary | ICD-10-CM

## 2021-07-26 NOTE — Progress Notes (Signed)
   Acute Office Visit  Subjective:    Patient ID: Lindsey Norton, female    DOB: [birth year], 60 y.o.   MRN: [id number]   HPI 60 y.o. presents today for postmenopausal bleeding. Bleeding started a couple of weeks ago. She felt like she experienced PMS symptoms of sore breasts and back pain prior. Bleeding was moderate for a couple of days, then became very light, then increased again 5 days ago. No bleeding today. She had D&C/hysteroscopy with Myosure 05/2019 after having no menses for several years and experiencing 3 period-like episodes, benign endometrial polyp removed at that time.   Review of Systems  Constitutional: Negative.   Genitourinary:  Positive for vaginal bleeding.      Objective:    Physical Exam Constitutional:      Appearance: Normal appearance.  Genitourinary:    General: Normal vulva.     Vagina: Normal.     Cervix: Normal.     Uterus: Normal.     BP (!) 160/86 (Cuff Size: Large)   LMP 01/03/2019  Wt Readings from Last 3 Encounters:  05/26/20 289 lb 6.4 oz (131.3 kg)  06/01/19 279 lb 3.2 oz (126.6 kg)  01/14/19 289 lb (131.1 kg)        Assessment & Plan:   Problem List Items Addressed This Visit   None Visit Diagnoses     PMB (postmenopausal bleeding)    -  Primary   Relevant Orders   US PELVIS TRANSVAGINAL NON-OB (TV ONLY)      Plan: We will schedule pelvic ultrasound for further evaluation.      Tamela Gammon DNP, 3:22 PM 07/26/2021

## 2021-08-07 DIAGNOSIS — R928 Other abnormal and inconclusive findings on diagnostic imaging of breast: Secondary | ICD-10-CM | POA: Diagnosis not present

## 2021-09-01 ENCOUNTER — Encounter: Payer: Self-pay | Admitting: Nurse Practitioner

## 2021-09-01 ENCOUNTER — Ambulatory Visit (INDEPENDENT_AMBULATORY_CARE_PROVIDER_SITE_OTHER): Payer: BC Managed Care – PPO | Admitting: Nurse Practitioner

## 2021-09-01 ENCOUNTER — Other Ambulatory Visit: Payer: Self-pay

## 2021-09-01 VITALS — BP 124/80 | Ht 66.0 in | Wt 282.0 lb

## 2021-09-01 DIAGNOSIS — Z01419 Encounter for gynecological examination (general) (routine) without abnormal findings: Secondary | ICD-10-CM | POA: Diagnosis not present

## 2021-09-01 DIAGNOSIS — Z780 Asymptomatic menopausal state: Secondary | ICD-10-CM | POA: Diagnosis not present

## 2021-09-01 NOTE — Progress Notes (Signed)
   Lindsey Norton 13-Jun-1961 [id number]   History:  60 y.o. G0 presents for annual exam without GYN complaints. Postmenopausal, no HRT. Had recent episode of PMB, pelvic ultrasound scheduled 10/27. No bleeding since. 05/2019 D&C/hysteroscopy with Myosure 05/2019 after having no menses for several years and experiencing 3 period-like episodes, benign endometrial polyp removed at that time. Normal pap and mammogram history. Primary care manages HTN and prediabetes.   Gynecologic History Patient's last menstrual period was 01/03/2019.   Contraception: post menopausal status  Health Maintenance Last Pap: 01/14/2019. Results were: Normal Last mammogram: 08/2021. Results were: Normal Last colonoscopy: 2009. Results were: Normal, 10-year recall Last Dexa: Not indicated  Past medical history, past surgical history, family history and social history were all reviewed and documented in the EPIC chart. Works at [employer] was performed and pertinent positives and negatives are included.  Exam:  Vitals:   09/01/21 1353  BP: 124/80  Weight: 282 lb (127.9 kg)  Height: 5\' 6"  (1.676 m)    Body mass index is 45.52 kg/m.  General appearance:  Normal Thyroid:  Symmetrical, normal in size, without palpable masses or nodularity. Respiratory  Auscultation:  Clear without wheezing or rhonchi Cardiovascular  Auscultation:  Regular rate, without rubs, murmurs or gallops  Edema/varicosities:  Not grossly evident Abdominal  Soft,nontender, without masses, guarding or rebound.  Liver/spleen:  No organomegaly noted  Hernia:  None appreciated  Skin  Inspection:  Grossly normal   Breasts: Examined lying and sitting.   Right: Without masses, retractions, discharge or axillary adenopathy.   Left: Without masses, retractions, discharge or axillary adenopathy. Genitourinary   Inguinal/mons:  Normal without inguinal adenopathy  External genitalia:  Normal appearing vulva with no masses,  tenderness, or lesions  BUS/Urethra/Skene's glands:  Normal  Vagina:  Normal appearing with normal color and discharge, no lesions  Cervix:  Normal appearing without discharge or lesions  Uterus:  Difficult to palpate but no gross masses or tenderness  Adnexa/parametria:     Rt: Normal in size, without masses or tenderness.   Lt: Normal in size, without masses or tenderness.  Anus and perineum: Normal  Digital rectal exam: Normal sphincter tone without palpated masses or tenderness  Assessment/Plan:  60 y.o. G0 for annual exam.   Well female exam with routine gynecological exam - Plan: IGP, Aptima HPV. Education provided on SBEs, importance of preventative screenings, current guidelines, high calcium diet, regular exercise, and multivitamin daily. Labs with PCP.   Postmenopausal - no HRT. Recent episode of bleeding 07/2021, ultrasound scheduled 10/27. No bleeding since.   Screening for cervical cancer - Normal Pap history.  Pap today.   Screening for breast cancer - Normal mammogram history.  Continue annual screenings.  Normal breast exam today.  Screening for colon cancer - Overdue for colonoscopy and encouraged to schedule this soon.   Screening for osteoporosis - average risk. Will plan for DXA at age 30.   Follow up in 1 year for annual.      Tamela Gammon [facility], 2:03 PM 09/01/2021

## 2021-09-08 LAB — IGP, APTIMA HPV
HPV Aptima: NEGATIVE
PAP Smear Comment: 0

## 2021-09-14 ENCOUNTER — Encounter: Payer: Self-pay | Admitting: Obstetrics & Gynecology

## 2021-09-14 ENCOUNTER — Ambulatory Visit (INDEPENDENT_AMBULATORY_CARE_PROVIDER_SITE_OTHER): Payer: BC Managed Care – PPO

## 2021-09-14 ENCOUNTER — Ambulatory Visit: Payer: BC Managed Care – PPO | Admitting: Obstetrics & Gynecology

## 2021-09-14 ENCOUNTER — Other Ambulatory Visit: Payer: Self-pay

## 2021-09-14 VITALS — BP 122/78

## 2021-09-14 DIAGNOSIS — N950 Postmenopausal bleeding: Secondary | ICD-10-CM

## 2021-09-14 DIAGNOSIS — D219 Benign neoplasm of connective and other soft tissue, unspecified: Secondary | ICD-10-CM

## 2021-09-14 NOTE — Progress Notes (Addendum)
    Lindsey Norton Apr 03, 1961 [id number]        60 y.o.  G0P0000   RP: PMB early 08/2021 for Pelvic US  HPI: PMB early 08/2021, none since then.  No pelvic pain. Urine/BMs normal.   OB History  Gravida Para Term Preterm AB Living  0 0 0 0 0 0  SAB IAB Ectopic Multiple Live Births  0 0 0 0 0    Past medical history,surgical history, problem list, medications, allergies, family history and social history were all reviewed and documented in the EPIC chart.   Directed ROS with pertinent positives and negatives documented in the history of present illness/assessment and plan.  Exam:  Vitals:   09/14/21 0932  BP: 122/78   General appearance:  Normal  Pelvic US today: T/V images.  Comparison made to previous scan on April 27, 2013 and February 12, 2019.  Anteverted enlarged uterus with a 13 x 11 cm intramural fibroid again noted and slightly smaller than on previous scans.  The overall uterine size including the fibroid is 15.83 x 12.94 x 12.44 cm.  The endometrial lining is thin and symmetrical with no mass or thickening seen.  The endometrial lining is measured at 3.91 mm.  Left ovary not visualized again today vaginally or abdominally.  Right ovary is atrophic in appearance and best seen abdominally.  No adnexal mass seen.  No free fluid in the pelvis.   Assessment/Plan:  60 y.o. G0  1. PMB (postmenopausal bleeding)  PMB early 08/2021, none since then.  No pelvic pain. Urine/BMs normal.  Pelvic US findings thoroughly reviewed.  Endometrial line thin and normal at 3.91 mm.  Patient reassured.  2. Fibroid  Smaller than on previous US, asymptomatic.  Reassured.  Princess Bruins MD, 9:50 AM 09/14/2021

## 2021-09-15 ENCOUNTER — Encounter: Payer: Self-pay | Admitting: Obstetrics & Gynecology

## 2022-04-03 DIAGNOSIS — J209 Acute bronchitis, unspecified: Secondary | ICD-10-CM | POA: Diagnosis not present

## 2022-04-03 DIAGNOSIS — I10 Essential (primary) hypertension: Secondary | ICD-10-CM | POA: Diagnosis not present

## 2022-06-29 DIAGNOSIS — I10 Essential (primary) hypertension: Secondary | ICD-10-CM | POA: Diagnosis not present

## 2022-06-29 DIAGNOSIS — E1169 Type 2 diabetes mellitus with other specified complication: Secondary | ICD-10-CM | POA: Diagnosis not present

## 2022-06-29 DIAGNOSIS — E7800 Pure hypercholesterolemia, unspecified: Secondary | ICD-10-CM | POA: Diagnosis not present

## 2022-07-29 DIAGNOSIS — B349 Viral infection, unspecified: Secondary | ICD-10-CM | POA: Diagnosis not present

## 2022-08-17 DIAGNOSIS — Z1231 Encounter for screening mammogram for malignant neoplasm of breast: Secondary | ICD-10-CM | POA: Diagnosis not present

## 2022-08-24 DIAGNOSIS — R921 Mammographic calcification found on diagnostic imaging of breast: Secondary | ICD-10-CM | POA: Diagnosis not present

## 2022-09-06 ENCOUNTER — Encounter: Payer: Self-pay | Admitting: Nurse Practitioner

## 2022-09-06 ENCOUNTER — Ambulatory Visit (INDEPENDENT_AMBULATORY_CARE_PROVIDER_SITE_OTHER): Payer: BC Managed Care – PPO | Admitting: Nurse Practitioner

## 2022-09-06 VITALS — BP 138/82 | HR 60 | Ht 66.0 in | Wt 274.0 lb

## 2022-09-06 DIAGNOSIS — Z01419 Encounter for gynecological examination (general) (routine) without abnormal findings: Secondary | ICD-10-CM

## 2022-09-06 DIAGNOSIS — Z780 Asymptomatic menopausal state: Secondary | ICD-10-CM

## 2022-09-06 NOTE — Patient Instructions (Signed)
Schedule Colonoscopy! ?[facility] ?[phone] ?[address] [place], [place] [address] ? ?

## 2022-09-06 NOTE — Progress Notes (Signed)
   Lindsey Norton 1961/07/06 [id number]   History:  61 y.o. G0 presents for annual exam without GYN complaints. Postmenopausal - no HRT. Had PMB last year, ultrasound showed 13 cm fibroid (smaller than previously seen) and normal lining 08/2021.  Normal pap and mammogram history. Primary care manages HTN and prediabetes.   Gynecologic History Patient's last menstrual period was 01/03/2019.   Contraception: post menopausal status Sexually active: No  Health Maintenance Last Pap: 09/01/2021. Results were: Normal neg HPV Last mammogram: 08/17/2022. Results were: Normal after follow up imaging Last colonoscopy: 2009. Results were: Normal Last Dexa: Not indicated  Past medical history, past surgical history, family history and social history were all reviewed and documented in the EPIC chart. 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  Exam:  Vitals:   09/06/22 1558  BP: 138/82  Pulse: 60  SpO2: 98%  Weight: 274 lb (124.3 kg)  Height: '5\' 6"'$  (1.676 m)     Body mass index is 44.22 kg/m.  General appearance:  Normal Thyroid:  Symmetrical, normal in size, without palpable masses or nodularity. Respiratory  Auscultation:  Clear without wheezing or rhonchi Cardiovascular  Auscultation:  Regular rate, without rubs, murmurs or gallops  Edema/varicosities:  Not grossly evident Abdominal  Soft,nontender, without masses, guarding or rebound.  Liver/spleen:  No organomegaly noted  Hernia:  None appreciated  Skin  Inspection:  Grossly normal   Breasts: Examined lying and sitting.   Right: Without masses, retractions, discharge or axillary adenopathy.   Left: Without masses, retractions, discharge or axillary adenopathy. Genitourinary   Inguinal/mons:  Normal without inguinal adenopathy  External genitalia:  Normal appearing vulva with no masses, tenderness, or lesions  BUS/Urethra/Skene's glands:   Normal  Vagina:  Normal appearing with normal color and discharge, no lesions  Cervix:  Normal appearing without discharge or lesions  Uterus:  Difficult to palpate due to body habitus but no gross masses or tenderness  Adnexa/parametria:     Rt: Normal in size, without masses or tenderness.   Lt: Normal in size, without masses or tenderness.  Anus and perineum: Normal  Digital rectal exam: Normal sphincter tone without palpated masses or tenderness  Assessment/Plan:  61 y.o. G0 for annual exam.   Well female exam with routine gynecological exam - Education provided on SBEs, importance of preventative screenings, current guidelines, high calcium diet, regular exercise, and multivitamin daily. Labs with PCP.   Postmenopausal - no HRT, no bleeding.   Screening for cervical cancer - Normal Pap history. Will repeat at 5-year interval per guidelines.   Screening for breast cancer - Normal mammogram history.  Continue annual screenings.  Normal breast exam today.  Screening for colon cancer - Overdue for colonoscopy and encouraged to schedule this Information provided on [facility].   Screening for osteoporosis - Average risk. Will plan for DXA at age 16.   Follow up in 1 year for annual.      Tamela Gammon [facility], 4:26 PM 09/06/2022

## 2022-09-10 ENCOUNTER — Encounter: Payer: Self-pay | Admitting: Nurse Practitioner

## 2023-08-23 ENCOUNTER — Telehealth: Payer: Self-pay | Admitting: *Deleted

## 2023-08-23 NOTE — Telephone Encounter (Signed)
Patient left message asking when next PAP due.

## 2023-08-26 NOTE — Telephone Encounter (Signed)
PAP 09/01/21 Normal, neg HPV  Per review of 09/06/22 AEX, repeat 5 year intervals.    Call returned to patient, left detailed message, ok per dpr. Advised next AEX due after 09/07/2023. Next pap 2027. Return call to office at [phone], option 1 to schedule.   Routing to provider for final review. Will close encounter.

## 2023-12-26 ENCOUNTER — Encounter: Payer: Self-pay | Admitting: Nurse Practitioner

## 2023-12-26 ENCOUNTER — Ambulatory Visit: Payer: 59 | Admitting: Nurse Practitioner

## 2023-12-26 VITALS — BP 134/62 | HR 65 | Ht 67.0 in | Wt 279.0 lb

## 2023-12-26 DIAGNOSIS — Z01419 Encounter for gynecological examination (general) (routine) without abnormal findings: Secondary | ICD-10-CM | POA: Diagnosis not present

## 2023-12-26 DIAGNOSIS — Z780 Asymptomatic menopausal state: Secondary | ICD-10-CM | POA: Diagnosis not present

## 2023-12-26 NOTE — Progress Notes (Signed)
   Lindsey Norton 09-May-1961 [id number]   History:  63 y.o. G0 presents for annual exam without GYN complaints. Postmenopausal - no HRT. Normal pap and mammogram history. Primary care manages HTN and prediabetes.   Gynecologic History Patient's last menstrual period was 01/03/2019.   Contraception: post menopausal status Sexually active: No  Health Maintenance Last Pap: 09/01/2021. Results were: Normal neg HPV Last mammogram: 08/17/2022. Results were: Normal after follow up imaging Last colonoscopy: 2009. Results were: Normal Last Dexa: Not indicated Exercising: No Smoker: no   Past medical history, past surgical history, family history and social history were all reviewed and documented in the EPIC chart. Works in clinical biochemist for [employer].   ROS:  A ROS was performed and pertinent positives and negatives are included.  Exam:  Vitals:   12/26/23 1536  BP: 134/62  Pulse: 65  SpO2: 98%  Weight: 279 lb (126.6 kg)  Height: 5' 7 (1.702 m)      Body mass index is 43.7 kg/m.  General appearance:  Normal Thyroid:  Symmetrical, normal in size, without palpable masses or nodularity. Respiratory  Auscultation:  Clear without wheezing or rhonchi Cardiovascular  Auscultation:  Regular rate, without rubs, murmurs or gallops  Edema/varicosities:  Not grossly evident Abdominal  Soft,nontender, without masses, guarding or rebound.  Liver/spleen:  No organomegaly noted  Hernia:  None appreciated  Skin  Inspection:  Grossly normal   Breasts: Examined lying and sitting.   Right: Without masses, retractions, discharge or axillary adenopathy.   Left: Without masses, retractions, discharge or axillary adenopathy. Pelvic: External genitalia:  no lesions              Urethra:  normal appearing urethra with no masses, tenderness or lesions              Bartholins and Skenes: normal                 Vagina: normal appearing vagina with normal color and discharge, no lesions               Cervix: no lesions Bimanual Exam:  Uterus:  no masses or tenderness              Adnexa: no mass, fullness, tenderness              Rectovaginal: Deferred              Anus:  normal, no lesions  Patient informed chaperone available to be present for breast and pelvic exam. Patient has requested no chaperone to be present. Patient has been advised what will be completed during breast and pelvic exam.   Assessment/Plan:  63 y.o. G0 for annual exam.   Well female exam with routine gynecological exam - Education provided on SBEs, importance of preventative screenings, current guidelines, high calcium diet, regular exercise, and multivitamin daily. Labs with PCP.   Postmenopausal - no HRT, no bleeding.   Screening for cervical cancer - Normal Pap history. Will repeat at 5-year interval per guidelines.   Screening for breast cancer - Normal mammogram history.  Thinks she had mammogram in 2024. Continue annual screenings.  Normal breast exam today.  Screening for colon cancer - Overdue for colonoscopy and encouraged to schedule this.   Screening for osteoporosis - Average risk. Will plan for DXA at age 74.   Return in about 1 year (around 12/25/2024) for Annual.       Lindsey Norton [facility], 4:13 PM 12/26/2023

## 2024-04-07 ENCOUNTER — Encounter: Payer: Self-pay | Admitting: Family Medicine

## 2025-01-27 ENCOUNTER — Ambulatory Visit: Admitting: Nurse Practitioner
